# Patient Record
Sex: Male | Born: 1975 | Race: White | Hispanic: No | Marital: Single | Smoking: Current every day smoker
Health system: Southern US, Community
[De-identification: ages and names within clinical notes are randomized; demographics above are authoritative.]

---

## 1999-09-14 ENCOUNTER — Encounter: Payer: Self-pay | Admitting: Emergency Medicine

## 1999-09-14 ENCOUNTER — Emergency Department (HOSPITAL_COMMUNITY): Admission: EM | Admit: 1999-09-14 | Discharge: 1999-09-14 | Payer: Self-pay

## 2001-09-26 ENCOUNTER — Emergency Department (HOSPITAL_COMMUNITY): Admission: EM | Admit: 2001-09-26 | Discharge: 2001-09-26 | Payer: Self-pay | Admitting: Emergency Medicine

## 2001-09-26 ENCOUNTER — Encounter: Payer: Self-pay | Admitting: Emergency Medicine

## 2002-10-22 ENCOUNTER — Encounter: Payer: Self-pay | Admitting: *Deleted

## 2002-10-22 ENCOUNTER — Emergency Department (HOSPITAL_COMMUNITY): Admission: EM | Admit: 2002-10-22 | Discharge: 2002-10-22 | Payer: Self-pay | Admitting: *Deleted

## 2002-11-09 ENCOUNTER — Encounter: Payer: Self-pay | Admitting: *Deleted

## 2002-11-09 ENCOUNTER — Emergency Department (HOSPITAL_COMMUNITY): Admission: EM | Admit: 2002-11-09 | Discharge: 2002-11-09 | Payer: Self-pay | Admitting: Emergency Medicine

## 2003-03-17 ENCOUNTER — Emergency Department (HOSPITAL_COMMUNITY): Admission: EM | Admit: 2003-03-17 | Discharge: 2003-03-17 | Payer: Self-pay | Admitting: Emergency Medicine

## 2004-02-12 ENCOUNTER — Emergency Department (HOSPITAL_COMMUNITY): Admission: EM | Admit: 2004-02-12 | Discharge: 2004-02-12 | Payer: Self-pay | Admitting: Emergency Medicine

## 2004-04-02 ENCOUNTER — Emergency Department (HOSPITAL_COMMUNITY): Admission: EM | Admit: 2004-04-02 | Discharge: 2004-04-02 | Payer: Self-pay | Admitting: Emergency Medicine

## 2004-08-28 ENCOUNTER — Emergency Department (HOSPITAL_COMMUNITY): Admission: EM | Admit: 2004-08-28 | Discharge: 2004-08-28 | Payer: Self-pay | Admitting: Emergency Medicine

## 2005-02-24 ENCOUNTER — Ambulatory Visit (HOSPITAL_COMMUNITY): Admission: RE | Admit: 2005-02-24 | Discharge: 2005-02-24 | Payer: Self-pay | Admitting: Family Medicine

## 2005-03-05 ENCOUNTER — Emergency Department (HOSPITAL_COMMUNITY): Admission: EM | Admit: 2005-03-05 | Discharge: 2005-03-05 | Payer: Self-pay | Admitting: Emergency Medicine

## 2005-03-08 ENCOUNTER — Emergency Department (HOSPITAL_COMMUNITY): Admission: EM | Admit: 2005-03-08 | Discharge: 2005-03-08 | Payer: Self-pay | Admitting: Emergency Medicine

## 2005-09-08 ENCOUNTER — Emergency Department (HOSPITAL_COMMUNITY): Admission: EM | Admit: 2005-09-08 | Discharge: 2005-09-09 | Payer: Self-pay | Admitting: Emergency Medicine

## 2006-04-03 IMAGING — CR DG HAND COMPLETE 3+V*R*
3 series · 3 of 3 positions shown · non-contrast
Comparison: none

CLINICAL DATA: Pain in the arm; third finger swollen; no known injury 
 RIGHT HAND COMPLETE:
 There is mild soft tissue swelling of the third finger.  I see no fracture or erosive arthropathy.  No foreign body or calcifications can be seen.

[view not recorded (1 of 3)]
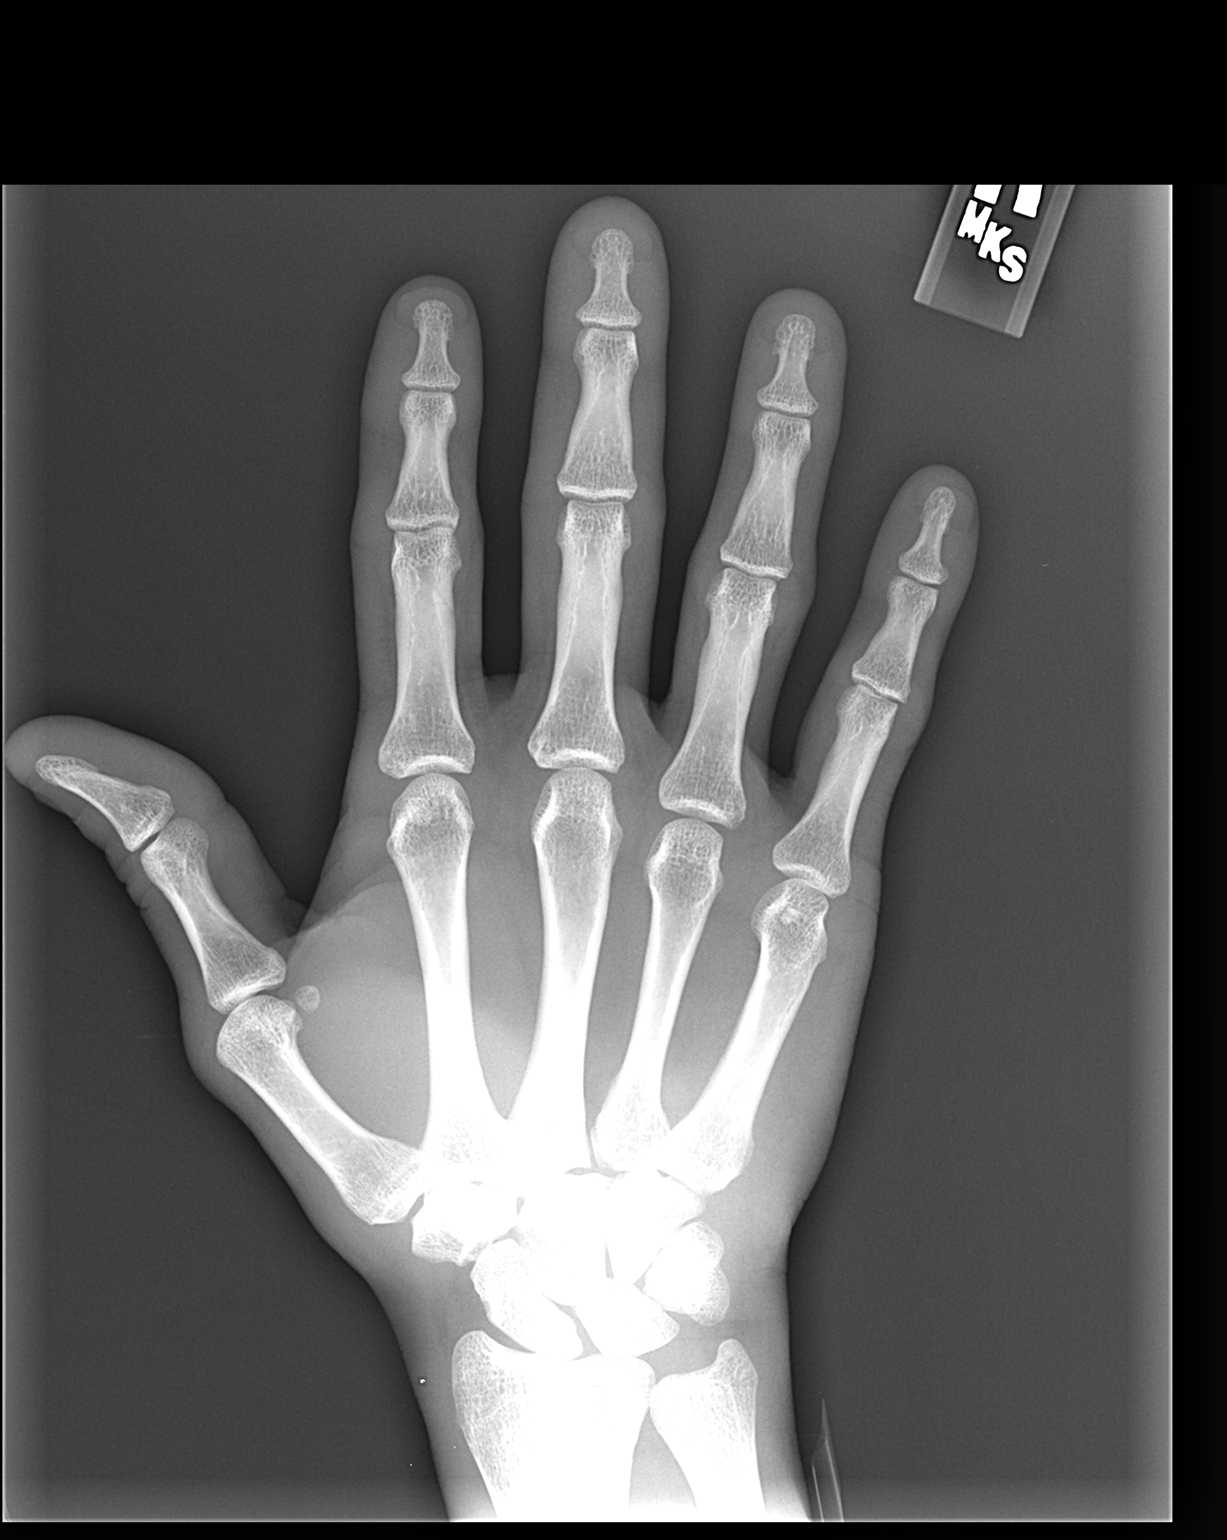

[view not recorded (2 of 3)]
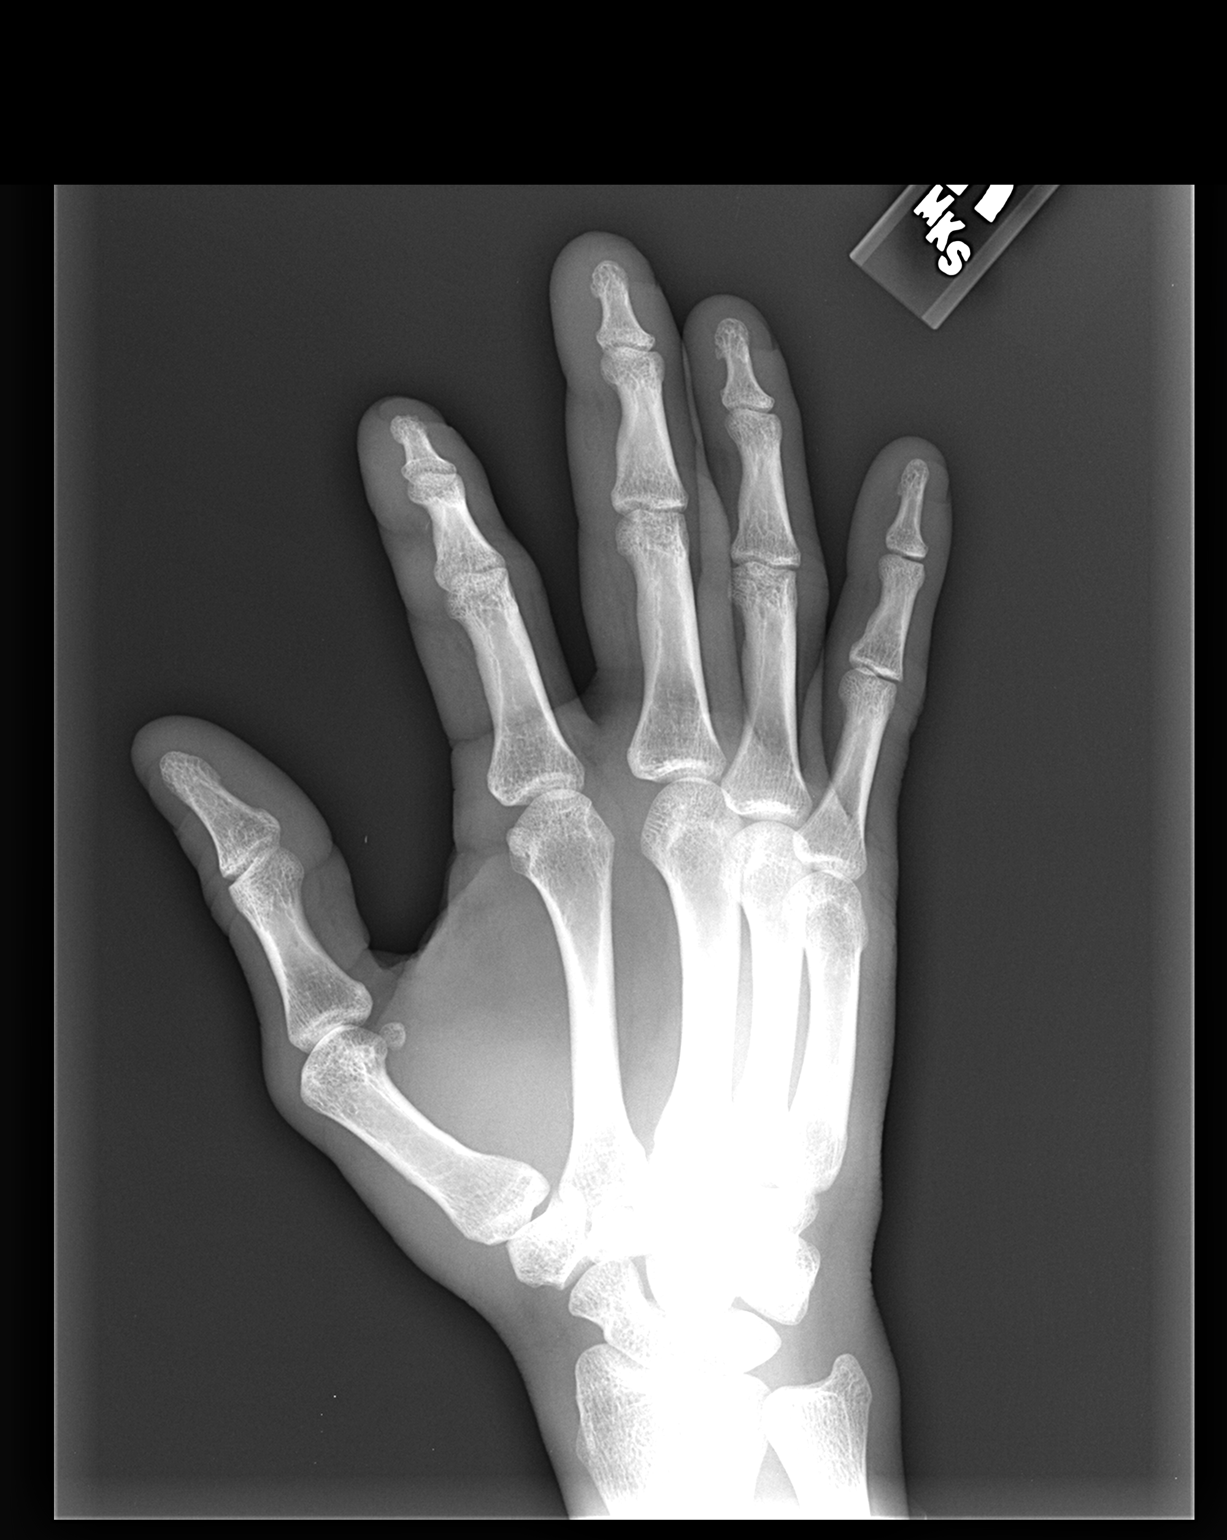

[view not recorded (3 of 3)]
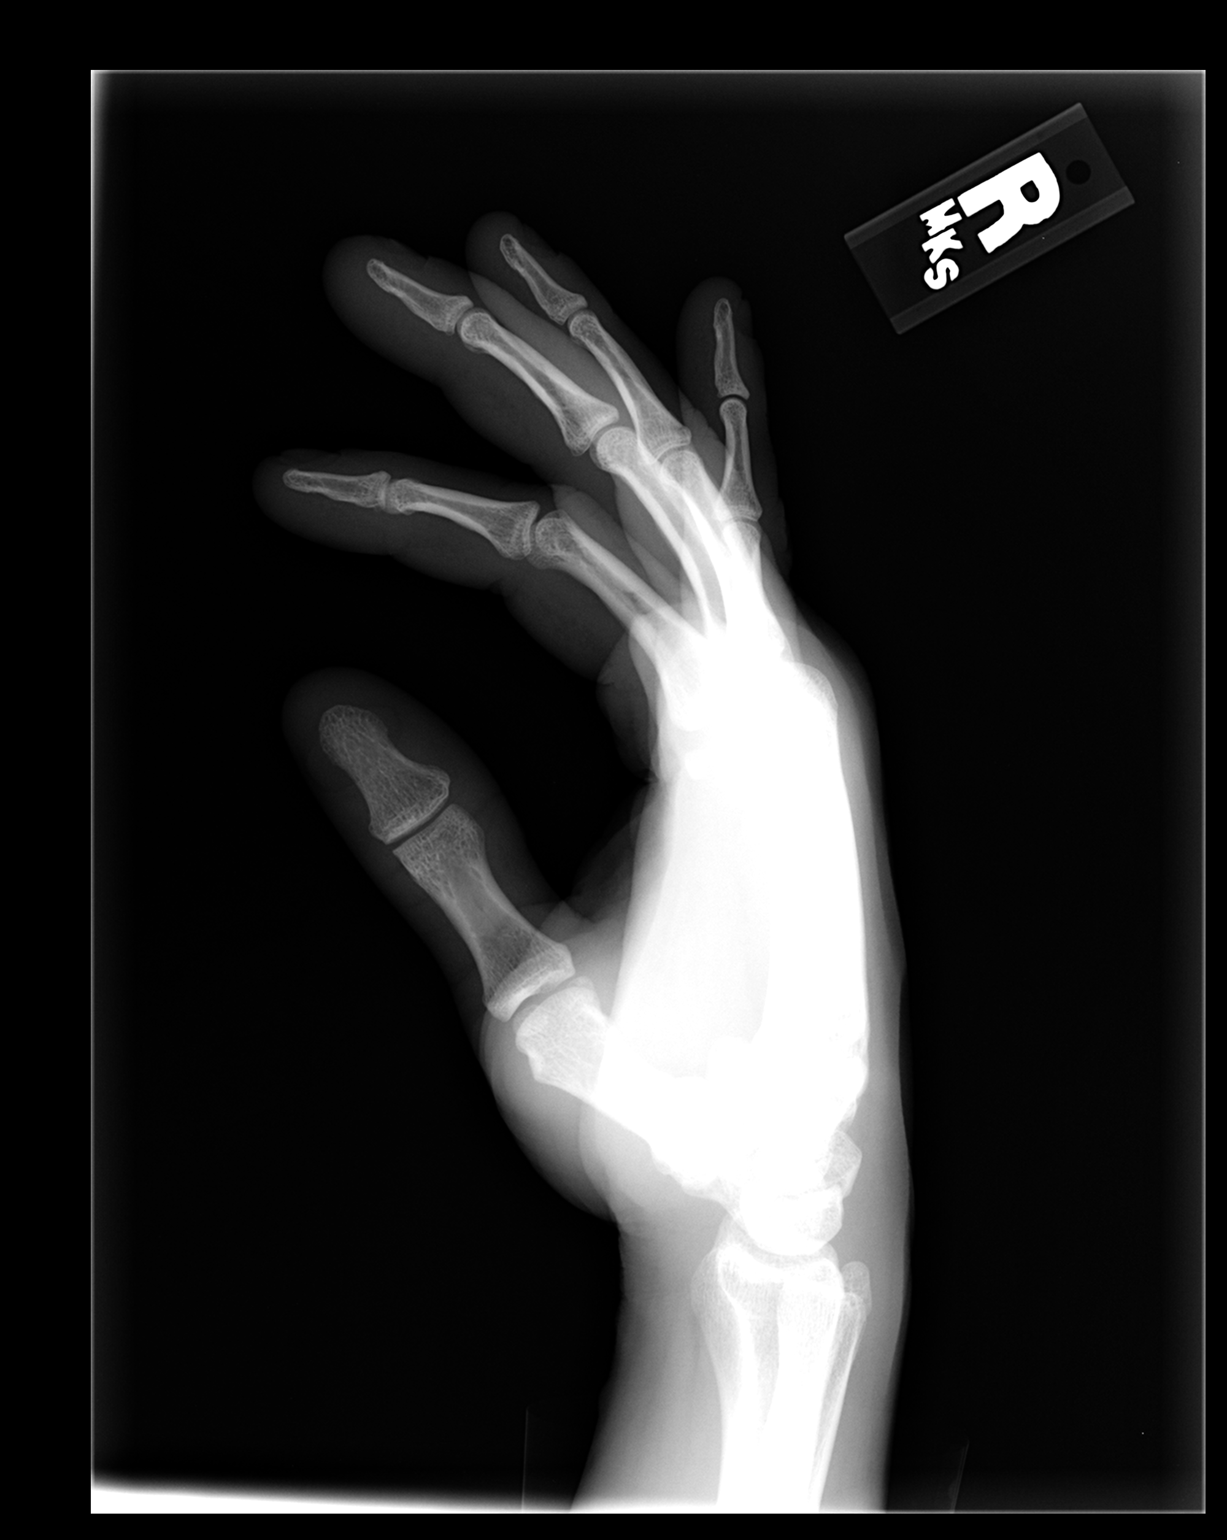

[3 of 3 positions shown; findings below may reference images not displayed]

IMPRESSION: Mild soft tissue swelling third finger.  No fracture or other acute pathology.

## 2006-04-13 ENCOUNTER — Emergency Department (HOSPITAL_COMMUNITY): Admission: EM | Admit: 2006-04-13 | Discharge: 2006-04-13 | Payer: Self-pay | Admitting: Emergency Medicine

## 2006-06-05 ENCOUNTER — Emergency Department (HOSPITAL_COMMUNITY): Admission: EM | Admit: 2006-06-05 | Discharge: 2006-06-05 | Payer: Self-pay | Admitting: Emergency Medicine

## 2006-06-07 ENCOUNTER — Emergency Department (HOSPITAL_COMMUNITY): Admission: EM | Admit: 2006-06-07 | Discharge: 2006-06-07 | Payer: Self-pay | Admitting: Emergency Medicine

## 2008-03-19 ENCOUNTER — Emergency Department (HOSPITAL_COMMUNITY): Admission: EM | Admit: 2008-03-19 | Discharge: 2008-03-19 | Payer: Self-pay | Admitting: Emergency Medicine

## 2009-07-10 ENCOUNTER — Emergency Department (HOSPITAL_COMMUNITY): Admission: EM | Admit: 2009-07-10 | Discharge: 2009-07-10 | Payer: Self-pay | Admitting: Emergency Medicine

## 2009-07-22 ENCOUNTER — Emergency Department (HOSPITAL_COMMUNITY): Admission: EM | Admit: 2009-07-22 | Discharge: 2009-07-22 | Payer: Self-pay | Admitting: Emergency Medicine

## 2009-09-30 ENCOUNTER — Emergency Department (HOSPITAL_COMMUNITY): Admission: EM | Admit: 2009-09-30 | Discharge: 2009-09-30 | Payer: Self-pay | Admitting: Emergency Medicine

## 2011-01-01 ENCOUNTER — Emergency Department (HOSPITAL_COMMUNITY)
Admission: EM | Admit: 2011-01-01 | Discharge: 2011-01-01 | Disposition: A | Discharge status: Home / Self Care | Payer: Self-pay | Attending: Emergency Medicine | Admitting: Emergency Medicine

## 2011-01-01 DIAGNOSIS — K089 Disorder of teeth and supporting structures, unspecified: Secondary | ICD-10-CM | POA: Insufficient documentation

## 2011-03-08 LAB — WOUND CULTURE

## 2011-04-06 ENCOUNTER — Emergency Department (HOSPITAL_COMMUNITY)
Admission: EM | Admit: 2011-04-06 | Discharge: 2011-04-06 | Discharge status: Against Medical Advice | Payer: Self-pay | Attending: Emergency Medicine | Admitting: Emergency Medicine

## 2011-04-06 DIAGNOSIS — J45909 Unspecified asthma, uncomplicated: Secondary | ICD-10-CM | POA: Insufficient documentation

## 2011-04-06 DIAGNOSIS — F1210 Cannabis abuse, uncomplicated: Secondary | ICD-10-CM | POA: Insufficient documentation

## 2011-04-06 DIAGNOSIS — F1010 Alcohol abuse, uncomplicated: Secondary | ICD-10-CM | POA: Insufficient documentation

## 2011-04-06 DIAGNOSIS — F1410 Cocaine abuse, uncomplicated: Secondary | ICD-10-CM | POA: Insufficient documentation

## 2011-04-06 DIAGNOSIS — F3130 Bipolar disorder, current episode depressed, mild or moderate severity, unspecified: Secondary | ICD-10-CM | POA: Insufficient documentation

## 2011-04-06 DIAGNOSIS — F17200 Nicotine dependence, unspecified, uncomplicated: Secondary | ICD-10-CM | POA: Insufficient documentation

## 2011-04-06 DIAGNOSIS — F411 Generalized anxiety disorder: Secondary | ICD-10-CM | POA: Insufficient documentation

## 2011-04-06 LAB — URINALYSIS, ROUTINE W REFLEX MICROSCOPIC
Bilirubin Urine: NEGATIVE
Ketones, ur: NEGATIVE mg/dL
Specific Gravity, Urine: <1.005 — ABNORMAL LOW (ref 1.005–1.030)
Urobilinogen, UA: 0.2 mg/dL (ref 0.0–1.0)

## 2011-04-06 LAB — RAPID URINE DRUG SCREEN, HOSP PERFORMED
Benzodiazepines: NOT DETECTED
Tetrahydrocannabinol: POSITIVE — AB

## 2011-10-22 ENCOUNTER — Emergency Department (HOSPITAL_COMMUNITY): Payer: Self-pay

## 2011-10-22 ENCOUNTER — Emergency Department (HOSPITAL_COMMUNITY)
Admission: EM | Admit: 2011-10-22 | Discharge: 2011-10-22 | Disposition: A | Discharge status: Home / Self Care | Payer: Self-pay | Attending: Emergency Medicine | Admitting: Emergency Medicine

## 2011-10-22 ENCOUNTER — Encounter: Payer: Self-pay | Admitting: Emergency Medicine

## 2011-10-22 DIAGNOSIS — J40 Bronchitis, not specified as acute or chronic: Secondary | ICD-10-CM | POA: Insufficient documentation

## 2011-10-22 DIAGNOSIS — F17200 Nicotine dependence, unspecified, uncomplicated: Secondary | ICD-10-CM | POA: Insufficient documentation

## 2011-10-22 DIAGNOSIS — J45909 Unspecified asthma, uncomplicated: Secondary | ICD-10-CM | POA: Insufficient documentation

## 2011-10-22 MED ORDER — IPRATROPIUM BROMIDE 0.02 % IN SOLN
0.5000 mg | Freq: Once | RESPIRATORY_TRACT | Status: AC
  Administered 2011-10-22: 0.5 mg via RESPIRATORY_TRACT
  Filled 2011-10-22: qty 2.5

## 2011-10-22 MED ORDER — ALBUTEROL SULFATE HFA 108 (90 BASE) MCG/ACT IN AERS
2.0000 | INHALATION_SPRAY | Freq: Once | RESPIRATORY_TRACT | Status: AC
  Administered 2011-10-22: 2 via RESPIRATORY_TRACT
  Filled 2011-10-22: qty 6.7

## 2011-10-22 MED ORDER — AZITHROMYCIN 250 MG PO TABS: 250.0000 mg | ORAL_TABLET | Freq: Every day | ORAL | Status: AC

## 2011-10-22 MED ORDER — ALBUTEROL SULFATE (5 MG/ML) 0.5% IN NEBU
5.0000 mg | INHALATION_SOLUTION | Freq: Once | RESPIRATORY_TRACT | Status: AC
  Administered 2011-10-22: 5 mg via RESPIRATORY_TRACT
  Filled 2011-10-22: qty 1

## 2011-10-22 MED ORDER — ALBUTEROL SULFATE HFA 108 (90 BASE) MCG/ACT IN AERS: 1.0000 | INHALATION_SPRAY | Freq: Four times a day (QID) | RESPIRATORY_TRACT | Status: DC | PRN

## 2011-10-22 MED ORDER — PREDNISONE 50 MG PO TABS: 50.0000 mg | ORAL_TABLET | Freq: Every day | ORAL | Status: AC

## 2011-10-22 NOTE — ED Notes (Signed)
Respiratory paged for breathing treatment 

## 2011-10-22 NOTE — ED Provider Notes (Signed)
Scribed for James Hutching, MD, the patient was seen in room APA06/APA06. This chart was scribed by [employer]. The patient's care started at 09:43  CSN: [id number] Arrival date & time: 10/22/2011  9:33 AM   First MD Initiated Contact with Patient 10/22/11 [phone]      Chief Complaint  Patient presents with  . Nasal Congestion  . Cough  . Shortness of Breath    HPI James Baker is a 35 y.o. male with a history of Asthma who presents to the Emergency Department complaining of Nasal congestion, with associated cough, chills, cold sweats, fever and shortness of breath for 3 days. He states that cough is productive with thick green sputum. He also complains of running out of his breathing treatment and currently uses his mother's as needed.  Past Medical History  Diagnosis Date  . Asthma     History reviewed. No pertinent past surgical history.  Family History  Problem Relation Age of Onset  . Cancer Mother   . Stroke Father   . Seizures Father   . Cancer Other     History  Substance Use Topics  . Smoking status: Current Everyday Smoker -- 1.0 packs/day for 15 years    Types: Cigarettes  . Smokeless tobacco: Never Used  . Alcohol Use: 1.8 oz/week    3 Cans of beer per week     occasionally      Review of Systems  Constitutional: Positive for fever and chills.       10 Systems reviewed and are negative for acute change except as noted in the HPI.  HENT: Positive for congestion.   Eyes: Negative for discharge and redness.  Respiratory: Positive for cough and shortness of breath.   Cardiovascular: Negative for chest pain.  Gastrointestinal: Negative for vomiting and abdominal pain.  Musculoskeletal: Negative for back pain.  Skin: Negative for rash.  Neurological: Negative for syncope, numbness and headaches.  Psychiatric/Behavioral:       No behavior change.  All other systems reviewed and are negative.    Allergies  Hydrocodone  Home Medications   Current  Outpatient Rx  Name Route Sig Dispense Refill  . ACETAMINOPHEN 500 MG PO TABS Oral Take 1,500 mg by mouth every 6 (six) hours as needed. For fever     . ALBUTEROL SULFATE HFA 108 (90 BASE) MCG/ACT IN AERS Inhalation Inhale 2 puffs into the lungs every 4 (four) hours as needed. For shortness of breath       BP 131/74  Pulse 57  Temp(Src) 98 F (36.7 C) (Oral)  Resp 18  Ht 5\' 11"  (1.803 m)  Wt 135 lb (61.236 kg)  BMI 18.83 kg/m2  SpO2 96%  Physical Exam  Nursing note and vitals reviewed. Constitutional: He is oriented to person, place, and time. He appears well-developed and well-nourished.  Non-toxic appearance. He does not have a sickly appearance.  HENT:  Head: Normocephalic and atraumatic.  Eyes: Conjunctivae, EOM and lids are normal. Pupils are equal, round, and reactive to light.  Neck: Trachea normal, normal range of motion and full passive range of motion without pain. Neck supple.  Cardiovascular: Regular rhythm and normal heart sounds.   Pulmonary/Chest: Effort normal. No respiratory distress. He has wheezes (Expiratory wheezes bilaterally).  Abdominal: Soft. Normal appearance. He exhibits no distension. There is no tenderness. There is no rebound and no CVA tenderness.  Musculoskeletal: Normal range of motion.  Neurological: He is alert and oriented to person, place, and time. He has  normal strength.  Skin: Skin is warm, dry and intact. No rash noted.  Psychiatric: He has a normal mood and affect.    ED Course  Procedures  DIAGNOSTIC STUDIES: Oxygen Saturation is 96% on room air, normal by my interpretation.    COORDINATION OF CARE: 10:10 - EDP examined patient at bedside. Patient to be given a breathing treatment. Chest X-ray ordered. Patient advised to cease smoking.   Dg Chest 2 View  10/22/2011  *RADIOLOGY REPORT*  Clinical Data: Smoker, asthma, cough  CHEST - 2 VIEW  Comparison: None  Findings: Normal heart size, mediastinal contours, and pulmonary vascularity.  Lungs are hyperexpanded with bronchitic changes. No pulmonary infiltrate, pleural effusion, or pneumothorax. Bones appear demineralized.  IMPRESSION: Hyperexpanded lungs with bronchitic changes. No acute infiltrate.  Original Report Authenticated By: Lollie Marrow, M.D.    MDM..... patient is a long-time smoker..  has had upper respiratory infection with wheezing.  x-ray negative. Charge with antibiotic, prednisone, inhaler.     James Hutching, MD 10/22/11 1137

## 2011-10-22 NOTE — ED Notes (Signed)
Patient c/o congestion, productive cough with thick green sputum, and shortness of breath. Per patient hx of asthma. Patient reports "running out" of inhaler medication.

## 2013-04-19 ENCOUNTER — Encounter (HOSPITAL_COMMUNITY): Payer: Self-pay | Admitting: *Deleted

## 2013-04-19 ENCOUNTER — Emergency Department (HOSPITAL_COMMUNITY)
Admission: EM | Admit: 2013-04-19 | Discharge: 2013-04-19 | Disposition: A | Discharge status: Home / Self Care | Payer: Self-pay | Attending: Emergency Medicine | Admitting: Emergency Medicine

## 2013-04-19 DIAGNOSIS — Z79899 Other long term (current) drug therapy: Secondary | ICD-10-CM | POA: Insufficient documentation

## 2013-04-19 DIAGNOSIS — T7840XA Allergy, unspecified, initial encounter: Secondary | ICD-10-CM

## 2013-04-19 DIAGNOSIS — J45909 Unspecified asthma, uncomplicated: Secondary | ICD-10-CM | POA: Insufficient documentation

## 2013-04-19 DIAGNOSIS — F17200 Nicotine dependence, unspecified, uncomplicated: Secondary | ICD-10-CM | POA: Insufficient documentation

## 2013-04-19 DIAGNOSIS — L500 Allergic urticaria: Secondary | ICD-10-CM | POA: Insufficient documentation

## 2013-04-19 MED ORDER — EPINEPHRINE 0.3 MG/0.3ML IJ SOAJ
0.3000 mg | Freq: Once | INTRAMUSCULAR | Status: AC
  Administered 2013-04-19: 0.3 mg via INTRAMUSCULAR
  Filled 2013-04-19: qty 0.3

## 2013-04-19 NOTE — ED Notes (Signed)
Pt has a rash that has been coming & going for the past 3 days. At this time rash noted to the right upper thigh & right arm. Pt states did stay in motel a few days when started & has been in a new building under [employer] representative.

## 2013-04-19 NOTE — ED Notes (Signed)
Rash to  Arms , legs , trunk intermittently for 3 days

## 2013-04-19 NOTE — ED Notes (Signed)
Pt alert & oriented x4, stable gait. Patient given discharge instructions, paperwork & prescription(s). Patient  instructed to stop at the registration desk to finish any additional paperwork. Patient verbalized understanding. Pt left department w/ no further questions. 

## 2013-04-19 NOTE — ED Provider Notes (Signed)
History  This chart was scribed for James Lennert, MD by James Baker, ED Scribe. This patient was seen in room APA04/APA04 and the patient's care was started at 8:55 PM.  CSN: [id number]  Arrival date & time 04/19/13  2026   First MD Initiated Contact with Patient 04/19/13 2055      Chief Complaint  Patient presents with  . Urticaria     Patient is a 37 y.o. male presenting with urticaria. The history is provided by the patient. No language interpreter was used.  Urticaria This is a new problem. The current episode started more than 2 days ago. The problem occurs daily. The problem has not changed since onset.Pertinent negatives include no chest pain, no abdominal pain and no headaches. Nothing aggravates the symptoms. Nothing relieves the symptoms. He has tried nothing for the symptoms.    HPI Comments: James Baker is a 37 y.o. male who presents to the Emergency Department complaining of 3 days of intermittent urticaria to the bilateral arms, legs and trunk. It is currently only on his right leg only. He reports that he recently traveled out of state and expresses concern over the symptoms being insect related. He denies any other symptoms currently. He has a h/o asthma. Pt is a current everyday smoker and occasional alcohol user.  Past Medical History  Diagnosis Date  . Asthma     History reviewed. No pertinent past surgical history.  Family History  Problem Relation Age of Onset  . Cancer Mother   . Stroke Father   . Seizures Father   . Cancer Other     History  Substance Use Topics  . Smoking status: Current Every Day Smoker -- 1.00 packs/day for 15 years    Types: Cigarettes  . Smokeless tobacco: Never Used  . Alcohol Use: 1.8 oz/week    3 Cans of beer per week     Comment: occasionally      Review of Systems  Constitutional: Negative for appetite change and fatigue.  HENT: Negative for congestion, sinus pressure and ear discharge.   Eyes: Negative  for discharge.  Respiratory: Negative for cough.   Cardiovascular: Negative for chest pain.  Gastrointestinal: Negative for abdominal pain and diarrhea.  Genitourinary: Negative for frequency and hematuria.  Musculoskeletal: Negative for back pain.  Skin: Positive for rash.  Neurological: Negative for seizures and headaches.  Psychiatric/Behavioral: Negative for hallucinations.    Allergies  Hydrocodone  Home Medications   Current Outpatient Rx  Name  Route  Sig  Dispense  Refill  . acetaminophen (TYLENOL) 500 MG tablet   Oral   Take 1,500 mg by mouth every 6 (six) hours as needed. For fever          . albuterol (PROVENTIL HFA;VENTOLIN HFA) 108 (90 BASE) MCG/ACT inhaler   Inhalation   Inhale 2 puffs into the lungs every 4 (four) hours as needed. For shortness of breath          . EXPIRED: albuterol (PROVENTIL HFA;VENTOLIN HFA) 108 (90 BASE) MCG/ACT inhaler   Inhalation   Inhale 1-2 puffs into the lungs every 6 (six) hours as needed for wheezing.   1 Inhaler   0     Triage Vitals: BP 125/81  Pulse 97  Temp(Src) 97.9 F (36.6 C) (Oral)  Resp 20  Ht 5\' 10"  (1.778 m)  Wt 135 lb (61.236 kg)  BMI 19.37 kg/m2  SpO2 97%  Physical Exam  Nursing note and vitals reviewed. Constitutional: He is  oriented to person, place, and time. He appears well-developed and well-nourished.  HENT:  Head: Normocephalic and atraumatic.  Eyes: Conjunctivae are normal.  Neck: No tracheal deviation present.  Cardiovascular: Normal rate.   Pulmonary/Chest: Effort normal. No respiratory distress.  Musculoskeletal: Normal range of motion. He exhibits no edema.  Neurological: He is alert and oriented to person, place, and time.  Skin: Skin is warm and dry. No rash noted. There is erythema.  Allergic rash to his right lower leg and abdomen   Psychiatric: He has a normal mood and affect. His behavior is normal.    ED Course  Procedures (including critical care time)  Medications   EPINEPHrine (EPI-PEN) injection 0.3 mg (not administered)    DIAGNOSTIC STUDIES: Oxygen Saturation is 97% on room air, normal by my interpretation.    COORDINATION OF CARE: 9:25 PM-Discussed treatment plan which includes epi-pen injection with pt at bedside and pt agreed to plan.   Labs Reviewed - No data to display No results found.   No diagnosis found.    MDM        The chart was scribed for me under my direct supervision.  I personally performed the history, physical, and medical decision making and all procedures in the evaluation of this patient.James Lennert, MD 04/19/13 2251

## 2014-09-09 ENCOUNTER — Emergency Department (HOSPITAL_COMMUNITY)
Admission: EM | Admit: 2014-09-09 | Discharge: 2014-09-09 | Disposition: A | Discharge status: Home / Self Care | Payer: Self-pay | Attending: Emergency Medicine | Admitting: Emergency Medicine

## 2014-09-09 ENCOUNTER — Encounter (HOSPITAL_COMMUNITY): Payer: Self-pay | Admitting: Emergency Medicine

## 2014-09-09 DIAGNOSIS — H18821 Corneal disorder due to contact lens, right eye: Secondary | ICD-10-CM

## 2014-09-09 DIAGNOSIS — Z791 Long term (current) use of non-steroidal anti-inflammatories (NSAID): Secondary | ICD-10-CM | POA: Insufficient documentation

## 2014-09-09 DIAGNOSIS — Z720 Tobacco use: Secondary | ICD-10-CM | POA: Insufficient documentation

## 2014-09-09 DIAGNOSIS — Z79899 Other long term (current) drug therapy: Secondary | ICD-10-CM | POA: Insufficient documentation

## 2014-09-09 DIAGNOSIS — J45909 Unspecified asthma, uncomplicated: Secondary | ICD-10-CM | POA: Insufficient documentation

## 2014-09-09 DIAGNOSIS — S0501XA Injury of conjunctiva and corneal abrasion without foreign body, right eye, initial encounter: Secondary | ICD-10-CM | POA: Insufficient documentation

## 2014-09-09 MED ORDER — OXYCODONE-ACETAMINOPHEN 5-325 MG PO TABS
1.0000 | ORAL_TABLET | Freq: Once | ORAL | Status: AC
  Administered 2014-09-09: 1 via ORAL
  Filled 2014-09-09: qty 1

## 2014-09-09 MED ORDER — HOMATROPINE HBR 5 % OP SOLN
2.0000 [drp] | Freq: Once | OPHTHALMIC | Status: AC
  Administered 2014-09-09: 2 [drp] via OPHTHALMIC
  Filled 2014-09-09: qty 5

## 2014-09-09 MED ORDER — OXYCODONE-ACETAMINOPHEN 5-325 MG PO TABS: 1.0000 | ORAL_TABLET | Freq: Four times a day (QID) | ORAL | Status: DC | PRN

## 2014-09-09 MED ORDER — TETRACAINE HCL 0.5 % OP SOLN
OPHTHALMIC | Status: AC
  Administered 2014-09-09: 2 [drp] via OPHTHALMIC
  Filled 2014-09-09: qty 2

## 2014-09-09 MED ORDER — KETOROLAC TROMETHAMINE 10 MG PO TABS
10.0000 mg | ORAL_TABLET | Freq: Once | ORAL | Status: AC
  Administered 2014-09-09: 10 mg via ORAL
  Filled 2014-09-09: qty 1

## 2014-09-09 MED ORDER — TOBRAMYCIN 0.3 % OP SOLN
2.0000 [drp] | Freq: Once | OPHTHALMIC | Status: AC
  Administered 2014-09-09: 2 [drp] via OPHTHALMIC
  Filled 2014-09-09: qty 5

## 2014-09-09 MED ORDER — TETRACAINE HCL 0.5 % OP SOLN
2.0000 [drp] | Freq: Once | OPHTHALMIC | Status: AC
  Administered 2014-09-09: 2 [drp] via OPHTHALMIC

## 2014-09-09 NOTE — Discharge Instructions (Signed)
Please apply a cool compress to your eye several times during the day. Please use dark glasses and a hat with a brim to protect the eye from bright lights. Please use 2 drops of tobramycin eye solution to the right eye every 4 hours for the next 5 days. Please use 600 mg of ibuprofen with each meal and at bedtime for swelling and inflammation. May use Percocet every 6 hours if needed for pain. This medication may cause drowsiness, please use with caution. Please see Dr. Bing PlumeHaynes, or the eye specialist of your choice on Monday or Tuesday of next week. Please do not use your contact lens for the next 2 weeks. Corneal Abrasion The cornea is the clear covering at the front and center of the eye. When looking at the colored portion of the eye (iris), you are looking through the cornea. This very thin tissue is made up of many layers. The surface layer is a single layer of cells (corneal epithelium) and is one of the most sensitive tissues in the body. If a scratch or injury causes the corneal epithelium to come off, it is called a corneal abrasion. If the injury extends to the tissues below the epithelium, the condition is called a corneal ulcer. CAUSES   Scratches.  Trauma.  Foreign body in the eye. Some people have recurrences of abrasions in the area of the original injury even after it has healed (recurrent erosion syndrome). Recurrent erosion syndrome generally improves and goes away with time. SYMPTOMS   Eye pain.  Difficulty or inability to keep the injured eye open.  The eye becomes very sensitive to light.  Recurrent erosions tend to happen suddenly, first thing in the morning, usually after waking up and opening the eye. DIAGNOSIS  Your health care provider can diagnose a corneal abrasion during an eye exam. Dye is usually placed in the eye using a drop or a small paper strip moistened by your tears. When the eye is examined with a special light, the abrasion shows up clearly because of the  dye. TREATMENT   Small abrasions may be treated with antibiotic drops or ointment alone.  A pressure patch may be put over the eye. If this is done, follow your doctor's instructions for when to remove the patch. Do not drive or use machines while the eye patch is on. Judging distances is hard to do with a patch on. If the abrasion becomes infected and spreads to the deeper tissues of the cornea, a corneal ulcer can result. This is serious because it can cause corneal scarring. Corneal scars interfere with light passing through the cornea and cause a loss of vision in the involved eye. HOME CARE INSTRUCTIONS  Use medicine or ointment as directed. Only take over-the-counter or prescription medicines for pain, discomfort, or fever as directed by your health care provider.  Do not drive or operate machinery if your eye is patched. Your ability to judge distances is impaired.  If your health care provider has given you a follow-up appointment, it is very important to keep that appointment. Not keeping the appointment could result in a severe eye infection or permanent loss of vision. If there is any problem keeping the appointment, let your health care provider know. SEEK MEDICAL CARE IF:   You have pain, light sensitivity, and a scratchy feeling in one eye or both eyes.  Your pressure patch keeps loosening up, and you can blink your eye under the patch after treatment.  Any kind of  discharge develops from the eye after treatment or if the lids stick together in the morning.  You have the same symptoms in the morning as you did with the original abrasion days, weeks, or months after the abrasion healed. MAKE SURE YOU:   Understand these instructions.  Will watch your condition.  Will get help right away if you are not doing well or get worse. Document Released: 11/14/2000 Document Revised: 11/22/2013 Document Reviewed: 07/25/2013 [facility]Exit[facility] Patient Information 2015 [place], [place]. This  information is not intended to replace advice given to you by your health care provider. Make sure you discuss any questions you have with your health care provider.

## 2014-09-09 NOTE — ED Provider Notes (Signed)
CSN: [id number]     Arrival date & time 09/09/14  1909 History   First MD Initiated Contact with Patient 09/09/14 1947     No chief complaint on file.    (Consider location/radiation/quality/duration/timing/severity/associated sxs/prior Treatment) HPI Comments: Pt slept with contact in the right eye last night. Pain this AM. The pain has continued throughout the day and seems to be getting progressively worse. Patient states he took the contact out, but continues to have pain involving the right eye. He presents to the emergency department at this time for evaluation and treatment of this problem. No fever or chills. No pus like drainage appreciated.  The history is provided by the patient.    Past Medical History  Diagnosis Date  . Asthma    History reviewed. No pertinent past surgical history. Family History  Problem Relation Age of Onset  . Cancer Mother   . Stroke Father   . Seizures Father   . Cancer Other    History  Substance Use Topics  . Smoking status: Current Every Day Smoker -- 1.00 packs/day for 15 years    Types: Cigarettes  . Smokeless tobacco: Never Used  . Alcohol Use: 1.8 oz/week    3 Cans of beer per week     Comment: occasionally    Review of Systems  Constitutional: Negative for activity change.       All ROS Neg except as noted in HPI  Eyes: Positive for photophobia, pain and redness. Negative for discharge.  Respiratory: Negative for cough, shortness of breath and wheezing.   Cardiovascular: Negative for chest pain and palpitations.  Gastrointestinal: Negative for abdominal pain and blood in stool.  Genitourinary: Negative for dysuria, frequency and hematuria.  Musculoskeletal: Negative for arthralgias, back pain and neck pain.  Skin: Negative.   Neurological: Negative for dizziness, seizures and speech difficulty.  Psychiatric/Behavioral: Negative for hallucinations and confusion.      Allergies  Hydrocodone  Home Medications   Prior  to Admission medications   Medication Sig Start Date End Date Taking? Authorizing Provider  albuterol (PROVENTIL HFA;VENTOLIN HFA) 108 (90 BASE) MCG/ACT inhaler Inhale 2 puffs into the lungs every 4 (four) hours as needed. For shortness of breath    Yes Historical Provider, MD  ibuprofen (ADVIL,MOTRIN) 200 MG tablet Take 200 mg by mouth every 6 (six) hours as needed for pain.   Yes Historical Provider, MD   BP 127/84  Pulse 64  Temp(Src) 97.8 F (36.6 C) (Oral)  Resp 17  Ht 5\' 10"  (1.778 m)  Wt 135 lb (61.236 kg)  BMI 19.37 kg/m2  SpO2 100% Physical Exam  Nursing note and vitals reviewed. Constitutional: He is oriented to person, place, and time. He appears well-developed and well-nourished.  Non-toxic appearance.  HENT:  Head: Normocephalic.  Right Ear: Tympanic membrane and external ear normal.  Left Ear: Tympanic membrane and external ear normal.  Eyes: EOM and lids are normal. Pupils are equal, round, and reactive to light.    Neck: Normal range of motion. Neck supple. Carotid bruit is not present.  Cardiovascular: Normal rate, regular rhythm, normal heart sounds, intact distal pulses and normal pulses.   Pulmonary/Chest: Breath sounds normal. No respiratory distress.  Abdominal: Soft. Bowel sounds are normal. There is no tenderness. There is no guarding.  Musculoskeletal: Normal range of motion.  Lymphadenopathy:       Head (right side): No submandibular adenopathy present.       Head (left side): No submandibular adenopathy present.  He has no cervical adenopathy.  Neurological: He is alert and oriented to person, place, and time. He has normal strength. No cranial nerve deficit or sensory deficit.  Skin: Skin is warm and dry.  Psychiatric: He has a normal mood and affect. His speech is normal.    ED Course  Procedures (including critical care time) Labs Review Labs Reviewed - No data to display  Imaging Review No results found.   EKG Interpretation None       MDM  Patient has a corneal abrasion of the right eye. Patient seen with me by Dr.  Judd Lienelo. No evidence of residual foreign body in the right eye.  The patient was treated with home atropine, oxycodone, and tobramycin eyedrops. The patient is referred to Dr. Bing PlumeHaynes with ophthalmology for evaluation in the next 24-48 hours.    Final diagnoses:  None    **I have reviewed nursing notes, vital signs, and all appropriate lab and imaging results for this patient.Kathie Dike*    Geoff Dacanay M Mechell Girgis, PA-C 09/11/14 1318

## 2014-09-09 NOTE — ED Notes (Signed)
Pt states he slept in his contacts last night and now his right eye is red, itchy, and irritated. Pt states it is painful.

## 2014-09-12 NOTE — ED Provider Notes (Signed)
Medical screening examination/treatment/procedure(s) were conducted as a shared visit with non-physician practitioner(s) and myself.  I personally evaluated the patient during the encounter.  Patient presents with complaints of eye irritation.  He slept in his contacts last night.  No other injury or trauma.  On exam, vitals are stable and he is afebrile.  The right eye is noted to be injected with clear discharge.  The cornea appears to have an abrasion.  There is fluorescein uptake to the majority of the cornea.  Will treat with antibiotic drops, follow up with ophthalmology.   EKG Interpretation None          James Lyonsouglas Mallie Giambra, MD 09/12/14 0740

## 2016-03-29 ENCOUNTER — Emergency Department (HOSPITAL_COMMUNITY)
Admission: EM | Admit: 2016-03-29 | Discharge: 2016-03-29 | Disposition: A | Discharge status: Home / Self Care | Payer: Self-pay | Attending: Emergency Medicine | Admitting: Emergency Medicine

## 2016-03-29 ENCOUNTER — Encounter (HOSPITAL_COMMUNITY): Payer: Self-pay | Admitting: Emergency Medicine

## 2016-03-29 ENCOUNTER — Emergency Department (HOSPITAL_COMMUNITY): Payer: Self-pay

## 2016-03-29 DIAGNOSIS — L02512 Cutaneous abscess of left hand: Secondary | ICD-10-CM | POA: Insufficient documentation

## 2016-03-29 DIAGNOSIS — J45909 Unspecified asthma, uncomplicated: Secondary | ICD-10-CM | POA: Insufficient documentation

## 2016-03-29 DIAGNOSIS — L03114 Cellulitis of left upper limb: Secondary | ICD-10-CM | POA: Insufficient documentation

## 2016-03-29 DIAGNOSIS — Z23 Encounter for immunization: Secondary | ICD-10-CM | POA: Insufficient documentation

## 2016-03-29 DIAGNOSIS — F17210 Nicotine dependence, cigarettes, uncomplicated: Secondary | ICD-10-CM | POA: Insufficient documentation

## 2016-03-29 DIAGNOSIS — L03119 Cellulitis of unspecified part of limb: Secondary | ICD-10-CM

## 2016-03-29 MED ORDER — CLINDAMYCIN HCL 150 MG PO CAPS: 300.0000 mg | ORAL_CAPSULE | Freq: Three times a day (TID) | ORAL | Status: DC

## 2016-03-29 MED ORDER — IBUPROFEN 400 MG PO TABS
600.0000 mg | ORAL_TABLET | Freq: Once | ORAL | Status: AC
  Administered 2016-03-29: 600 mg via ORAL
  Filled 2016-03-29: qty 2

## 2016-03-29 MED ORDER — OXYCODONE-ACETAMINOPHEN 5-325 MG PO TABS
2.0000 | ORAL_TABLET | Freq: Once | ORAL | Status: AC
  Administered 2016-03-29: 2 via ORAL
  Filled 2016-03-29: qty 2

## 2016-03-29 MED ORDER — POVIDONE-IODINE 10 % EX SOLN
CUTANEOUS | Status: AC
  Filled 2016-03-29: qty 118

## 2016-03-29 MED ORDER — CLINDAMYCIN HCL 150 MG PO CAPS
600.0000 mg | ORAL_CAPSULE | Freq: Once | ORAL | Status: AC
  Administered 2016-03-29: 600 mg via ORAL
  Filled 2016-03-29: qty 4

## 2016-03-29 MED ORDER — TETANUS-DIPHTH-ACELL PERTUSSIS 5-2.5-18.5 LF-MCG/0.5 IM SUSP
0.5000 mL | Freq: Once | INTRAMUSCULAR | Status: AC
  Administered 2016-03-29: 0.5 mL via INTRAMUSCULAR
  Filled 2016-03-29: qty 0.5

## 2016-03-29 MED ORDER — LIDOCAINE HCL (PF) 1 % IJ SOLN
5.0000 mL | Freq: Once | INTRAMUSCULAR | Status: AC
  Administered 2016-03-29: 5 mL
  Filled 2016-03-29: qty 5

## 2016-03-29 NOTE — Discharge Instructions (Signed)
Abscess °An abscess is an infected area that contains a collection of pus and debris. It can occur in almost any part of the body. An abscess is also known as a furuncle or boil. °CAUSES  °An abscess occurs when tissue gets infected. This can occur from blockage of oil or sweat glands, infection of hair follicles, or a minor injury to the skin. As the body tries to fight the infection, pus collects in the area and creates pressure under the skin. This pressure causes pain. People with weakened immune systems have difficulty fighting infections and get certain abscesses more often.  °SYMPTOMS °Usually an abscess develops on the skin and becomes a painful mass that is red, warm, and tender. If the abscess forms under the skin, you may feel a moveable soft area under the skin. Some abscesses break open (rupture) on their own, but most will continue to get worse without care. The infection can spread deeper into the body and eventually into the bloodstream, causing you to feel ill.  °DIAGNOSIS  °Your caregiver will take your medical history and perform a physical exam. A sample of fluid may also be taken from the abscess to determine what is causing your infection. °TREATMENT  °Your caregiver may prescribe antibiotic medicines to fight the infection. However, taking antibiotics alone usually does not cure an abscess. Your caregiver may need to make a small cut (incision) in the abscess to drain the pus. In some cases, gauze is packed into the abscess to reduce pain and to continue draining the area. °HOME CARE INSTRUCTIONS  °· Only take over-the-counter or prescription medicines for pain, discomfort, or fever as directed by your caregiver. °· If you were prescribed antibiotics, take them as directed. Finish them even if you start to feel better. °· If gauze is used, follow your caregiver's directions for changing the gauze. °· To avoid spreading the infection: °· Keep your draining abscess covered with a  bandage. °· Wash your hands well. °· Do not share personal care items, towels, or whirlpools with others. °· Avoid skin contact with others. °· Keep your skin and clothes clean around the abscess. °· Keep all follow-up appointments as directed by your caregiver. °SEEK MEDICAL CARE IF:  °· You have increased pain, swelling, redness, fluid drainage, or bleeding. °· You have muscle aches, chills, or a general ill feeling. °· You have a fever. °MAKE SURE YOU:  °· Understand these instructions. °· Will watch your condition. °· Will get help right away if you are not doing well or get worse. °  °This information is not intended to replace advice given to you by your health care provider. Make sure you discuss any questions you have with your health care provider. °  °Document Released: 08/27/2005 Document Revised: 05/18/2012 Document Reviewed: 01/30/2012 °Elsevier Interactive Patient Education ©2016 [employer] ° °Incision and Drainage °Incision and drainage is a procedure in which a sac-like structure (cystic structure) is opened and drained. The area to be drained usually contains material such as pus, fluid, or blood.  °LET YOUR CAREGIVER KNOW ABOUT:  °· Allergies to medicine. °· Medicines taken, including vitamins, herbs, eyedrops, over-the-counter medicines, and creams. °· Use of steroids (by mouth or creams). °· Previous problems with anesthetics or numbing medicines. °· History of bleeding problems or blood clots. °· Previous surgery. °· Other health problems, including diabetes and kidney problems. °· Possibility of pregnancy, if this applies. °RISKS AND COMPLICATIONS °· Pain. °· Bleeding. °· Scarring. °· Infection. °BEFORE THE PROCEDURE  °  You may need to have an ultrasound or other imaging tests to see how large or deep your cystic structure is. Blood tests may also be used to determine if you have an infection or how severe the infection is. You may need to have a tetanus shot. °PROCEDURE  °The affected area  is cleaned with a cleaning fluid. The cyst area will then be numbed with a medicine (local anesthetic). A small incision will be made in the cystic structure. A syringe or catheter may be used to drain the contents of the cystic structure, or the contents may be squeezed out. The area will then be flushed with a cleansing solution. After cleansing the area, it is often gently packed with a gauze or another wound dressing. Once it is packed, it will be covered with gauze and tape or some other type of wound dressing.  °AFTER THE PROCEDURE  °· Often, you will be allowed to go home right after the procedure. °· You may be given antibiotic medicine to prevent or heal an infection. °· If the area was packed with gauze or some other wound dressing, you will likely need to come back in 1 to 2 days to get it removed. °· The area should heal in about 14 days. °  °This information is not intended to replace advice given to you by your health care provider. Make sure you discuss any questions you have with your health care provider. °  °Document Released: 05/13/2001 Document Revised: 05/18/2012 Document Reviewed: 01/12/2012 °Elsevier Interactive Patient Education ©2016 [employer] ° °

## 2016-03-29 NOTE — ED Notes (Signed)
Patient c/o infection to left hand. Patient has swelling, redness, and redness to left hand. Per patient started as a white pimple on left index finger in which he squeezed. Patient states some serosanguinous drainage from finger. Hand warm to touch. Denies any fevers.

## 2016-03-29 NOTE — ED Provider Notes (Signed)
CSN: [id number]     Arrival date & time 03/29/16  1656 History   First MD Initiated Contact with Patient 03/29/16 1715     Chief Complaint  Patient presents with  . Cellulitis     (Consider location/radiation/quality/duration/timing/severity/associated sxs/prior Treatment) HPI   40 year old male with pain and swelling to left index finger. Onset about 2 days ago. Initially thought he had a small pimple in this area. He tried squeezing it and had minimal yellowish/bloody drainage. Pain, redness and swelling has been progressing since then. Denies any discrete trauma to the finger. No fevers or chills. No known history of diabetes mellitus or other immunocompromising factors.   Past Medical History  Diagnosis Date  . Asthma    History reviewed. No pertinent past surgical history. Family History  Problem Relation Age of Onset  . Cancer Mother   . Stroke Father   . Seizures Father   . Cancer Other    Social History  Substance Use Topics  . Smoking status: Current Every Day Smoker -- 1.00 packs/day for 15 years    Types: Cigarettes  . Smokeless tobacco: Never Used  . Alcohol Use: 1.8 oz/week    3 Cans of beer per week     Comment: occasionally    Review of Systems  All systems reviewed and negative, other than as noted in HPI.   Allergies  Hydrocodone  Home Medications   Prior to Admission medications   Medication Sig Start Date End Date Taking? Authorizing Provider  clindamycin (CLEOCIN) 150 MG capsule Take 2 capsules (300 mg total) by mouth 3 (three) times daily. 03/29/16   Raeford Razor, MD   BP 134/85 mmHg  Pulse 98  Temp(Src) 98.3 F (36.8 C) (Oral)  Resp 18  Ht  (1.778 m)  Wt 140 lb (63.504 kg)  BMI 20.09 kg/m2  SpO2 96% Physical Exam  Constitutional: He appears well-developed and well-nourished. No distress.  HENT:  Head: Normocephalic and atraumatic.  Eyes: Conjunctivae are normal. Right eye exhibits no discharge. Left eye exhibits no discharge.   Neck: Neck supple.  Cardiovascular: Normal rate, regular rhythm and normal heart sounds.  Exam reveals no gallop and no friction rub.   No murmur heard. Pulmonary/Chest: Effort normal and breath sounds normal. No respiratory distress.  Abdominal: Soft. He exhibits no distension. There is no tenderness.  Musculoskeletal: He exhibits no edema or tenderness.  Small abscess to the dorsal aspect of left index finger. Fluctuant. There is proximal redness extending past the MP joint into the dorsal aspect of the hand. Can actively range the PIP and MP joints although some increased pain. Neurovascular intact.  Neurological: He is alert.  Skin: Skin is warm and dry.  Psychiatric: He has a normal mood and affect. His behavior is normal. Thought content normal.  Nursing note and vitals reviewed.   ED Course  Procedures (including critical care time)  INCISION AND DRAINAGE Performed by: Raeford Razor Consent: Verbal consent obtained. Risks and benefits: risks, benefits and alternatives were discussed Type: abscess  Body area: L index finger  Anesthesia: local infiltration  Incision was made with a scalpel.  Local anesthetic: lidocaine 1% w/o epinephrine  Anesthetic total: 1 ml  Complexity: complex Blunt dissection to break up loculations  Drainage: purulent  Drainage amount: small  Packing material: none  Patient tolerance: Patient tolerated the procedure well with no immediate complications.     Labs Review Labs Reviewed - No data to display  Imaging Review Dg Finger Index Left  03/29/2016  CLINICAL DATA:  40 year old male with small abscess or cellulitis in the a left second finger. Evaluate for possible foreign body. EXAM: LEFT INDEX FINGER 2+V COMPARISON:  No priors. FINDINGS: There is no evidence of fracture or dislocation. There is no evidence of arthropathy or other focal bone abnormality. Soft tissues are diffusely swollen. IMPRESSION: 1. No retained radiopaque  foreign body. 2. Diffuse soft tissue swelling in the left second digit, without underlying bony abnormality. Electronically Signed   By: Trudie Reedaniel  Entrikin M.D.   On: 03/29/2016 17:58   I have personally reviewed and evaluated these images and lab results as part of my medical decision-making.   EKG Interpretation None      MDM   Final diagnoses:  Abscess of finger, left  Cellulitis of hand    40yM with abscess to dorsal L index finger. Mild swelling/erythema proximally onto hand concerning for developing cellulitis. No FB on XR. I&D'd. Continued abx. Wound care and return precautions discussed. Tetanus updated.     Raeford RazorStephen Jama Krichbaum, MD 04/07/16 [phone]

## 2016-03-29 NOTE — ED Notes (Signed)
Pt alert & oriented x4, stable gait. Patient given discharge instructions, paperwork & prescription(s). Patient  instructed to stop at the registration desk to finish any additional paperwork. Patient verbalized understanding. Pt left department w/ no further questions. 

## 2016-06-21 ENCOUNTER — Encounter (HOSPITAL_COMMUNITY): Payer: Self-pay | Admitting: Emergency Medicine

## 2016-06-21 ENCOUNTER — Emergency Department (HOSPITAL_COMMUNITY)
Admission: EM | Admit: 2016-06-21 | Discharge: 2016-06-21 | Disposition: A | Discharge status: Home / Self Care | Payer: Self-pay | Attending: Emergency Medicine | Admitting: Emergency Medicine

## 2016-06-21 DIAGNOSIS — W57XXXA Bitten or stung by nonvenomous insect and other nonvenomous arthropods, initial encounter: Secondary | ICD-10-CM | POA: Insufficient documentation

## 2016-06-21 DIAGNOSIS — J45909 Unspecified asthma, uncomplicated: Secondary | ICD-10-CM | POA: Insufficient documentation

## 2016-06-21 DIAGNOSIS — Y999 Unspecified external cause status: Secondary | ICD-10-CM | POA: Insufficient documentation

## 2016-06-21 DIAGNOSIS — S30860A Insect bite (nonvenomous) of lower back and pelvis, initial encounter: Secondary | ICD-10-CM | POA: Insufficient documentation

## 2016-06-21 DIAGNOSIS — F17210 Nicotine dependence, cigarettes, uncomplicated: Secondary | ICD-10-CM | POA: Insufficient documentation

## 2016-06-21 DIAGNOSIS — Y92009 Unspecified place in unspecified non-institutional (private) residence as the place of occurrence of the external cause: Secondary | ICD-10-CM | POA: Insufficient documentation

## 2016-06-21 DIAGNOSIS — Y9389 Activity, other specified: Secondary | ICD-10-CM | POA: Insufficient documentation

## 2016-06-21 MED ORDER — IBUPROFEN 600 MG PO TABS: 600.0000 mg | ORAL_TABLET | Freq: Four times a day (QID) | ORAL | Status: DC | PRN

## 2016-06-21 MED ORDER — OXYCODONE-ACETAMINOPHEN 5-325 MG PO TABS: 1.0000 | ORAL_TABLET | ORAL | Status: DC | PRN

## 2016-06-21 MED ORDER — OXYCODONE-ACETAMINOPHEN 5-325 MG PO TABS
1.0000 | ORAL_TABLET | Freq: Once | ORAL | Status: AC
  Administered 2016-06-21: 1 via ORAL
  Filled 2016-06-21: qty 1

## 2016-06-21 MED ORDER — SULFAMETHOXAZOLE-TRIMETHOPRIM 800-160 MG PO TABS: 1.0000 | ORAL_TABLET | Freq: Two times a day (BID) | ORAL | Status: AC

## 2016-06-21 NOTE — ED Provider Notes (Signed)
CSN: [id number]     Arrival date & time 06/21/16  1829 History   First MD Initiated Contact with Patient 06/21/16 1837     Chief Complaint  Patient presents with  . Insect Bite     (Consider location/radiation/quality/duration/timing/severity/associated sxs/prior Treatment) The history is provided by the patient.   James Baker is a 40 y.o. male presenting with a suspected insect bite (? Brown recluse) at his sacrum.  Two days ago he had to crawl into his water well house to prime the pump which stopped working. When he got out there was a "sore" place on his sacral area that has become increasingly painful, red and swollen.  He did not specifically recall being bit or stung, but knows there were insects in the well house. He denies fevers, chills, abdominal pain, nausea or vomiting.  He tried squeezing the site yesterday which worsened the pain.  He has found no other alleviators.    Past Medical History  Diagnosis Date  . Asthma    History reviewed. No pertinent past surgical history. Family History  Problem Relation Age of Onset  . Cancer Mother   . Stroke Father   . Seizures Father   . Cancer Other    Social History  Substance Use Topics  . Smoking status: Current Every Day Smoker -- 1.00 packs/day for 15 years    Types: Cigarettes  . Smokeless tobacco: Never Used  . Alcohol Use: 1.8 oz/week    3 Cans of beer per week     Comment: occasionally    Review of Systems  Constitutional: Negative for fever and chills.  Respiratory: Negative for shortness of breath and wheezing.   Gastrointestinal: Negative for nausea, vomiting and abdominal pain.  Skin: Positive for color change and wound.  Neurological: Negative for numbness.      Allergies  Hydrocodone  Home Medications   Prior to Admission medications   Medication Sig Start Date End Date Taking? Authorizing Provider  clindamycin (CLEOCIN) 150 MG capsule Take 2 capsules (300 mg total) by mouth 3 (three) times  daily. 03/29/16   Raeford Razor, MD  ibuprofen (ADVIL,MOTRIN) 600 MG tablet Take 1 tablet (600 mg total) by mouth every 6 (six) hours as needed. 06/21/16   Burgess Amor, PA-C  oxyCODONE-acetaminophen (PERCOCET/ROXICET) 5-325 MG tablet Take 1 tablet by mouth every 4 (four) hours as needed. 06/21/16   Burgess Amor, PA-C  sulfamethoxazole-trimethoprim (BACTRIM DS,SEPTRA DS) 800-160 MG tablet Take 1 tablet by mouth 2 (two) times daily. 06/21/16 06/28/16  Burgess Amor, PA-C   BP 134/98 mmHg  Pulse 88  Temp(Src) 98.2 F (36.8 C) (Oral)  Resp 18  Ht 5\' 10"  (1.778 m)  Wt 61.236 kg  BMI 19.37 kg/m2  SpO2 100% Physical Exam  Constitutional: He is oriented to person, place, and time. He appears well-developed and well-nourished.  HENT:  Head: Normocephalic.  Cardiovascular: Normal rate.   Pulmonary/Chest: Effort normal.  Neurological: He is alert and oriented to person, place, and time. No sensory deficit.  Skin:  Raised 1 cm nodule with yellow border and small central dark eschar.  Induration without fluctuance or drainage.  1 cm surrounding erythema.  Exquisitely ttp, located on sacrum.  Not invading pilonidal space. No red streaking.      ED Course  Procedures (including critical care time)  Informal bedside US performed with no pus pocket identified. Labs Review Labs Reviewed - No data to display  Imaging Review No results found. I have personally reviewed and  evaluated these images and lab results as part of my medical decision-making.   EKG Interpretation None      MDM   Final diagnoses:  Infected insect bite    Pt with raised infected lesion at sacrum,  Possibly infected insect bite, given recent exposure to dark enclosed space,  Possible brown recluse bite.  He was placed on bactrim, first dose given here.  Ibuprofen for pain and inflammation.  Few oxycodone for use the next 2 days until abx starts to improve.  Warm soaks.  Advised recheck by pcp or return here for any worsening or  spreading sx or if no changes beyond 48 hours of abx tx.    Burgess Amor, PA-C 06/21/16 2141  Margarita Grizzle, MD 06/21/16 [phone]

## 2016-06-21 NOTE — ED Notes (Signed)
Possible insect bite to buttock.  Rates pain 10/10.

## 2016-06-21 NOTE — Discharge Instructions (Signed)
You are being treated for  a skin infection which may have started as a simple pimple, but is also possible that this is the result of a brown recluse spider bite you have been given information about this possibility with the attached paperwork.  As discussed complete your entire course of antibiotics.  Do not drive within 4 hours of taking the medication prescribed for pain.  Apply warm soaks to the bite site for 10-15 minutes several times daily.  Do not squeeze the site.  Keep covered as long as it continues draining.  Get rechecked by your primary doctor or return here for any worsening symptoms as outlined below.

## 2017-04-27 IMAGING — DX DG FINGER INDEX 2+V*L*
3 series · 3 of 3 positions shown · non-contrast
Comparison: No priors.

CLINICAL DATA: 40-year-old male with small abscess or cellulitis in
the a left second finger. Evaluate for possible foreign body.

EXAM:
LEFT INDEX FINGER 2+V

[finger ap]
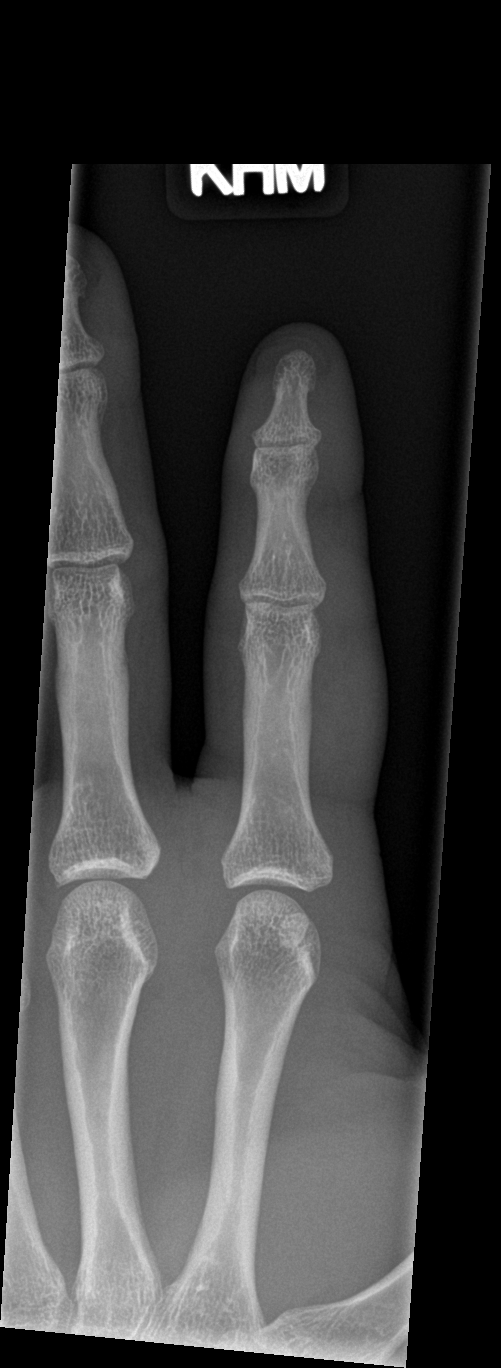

[finger obl]
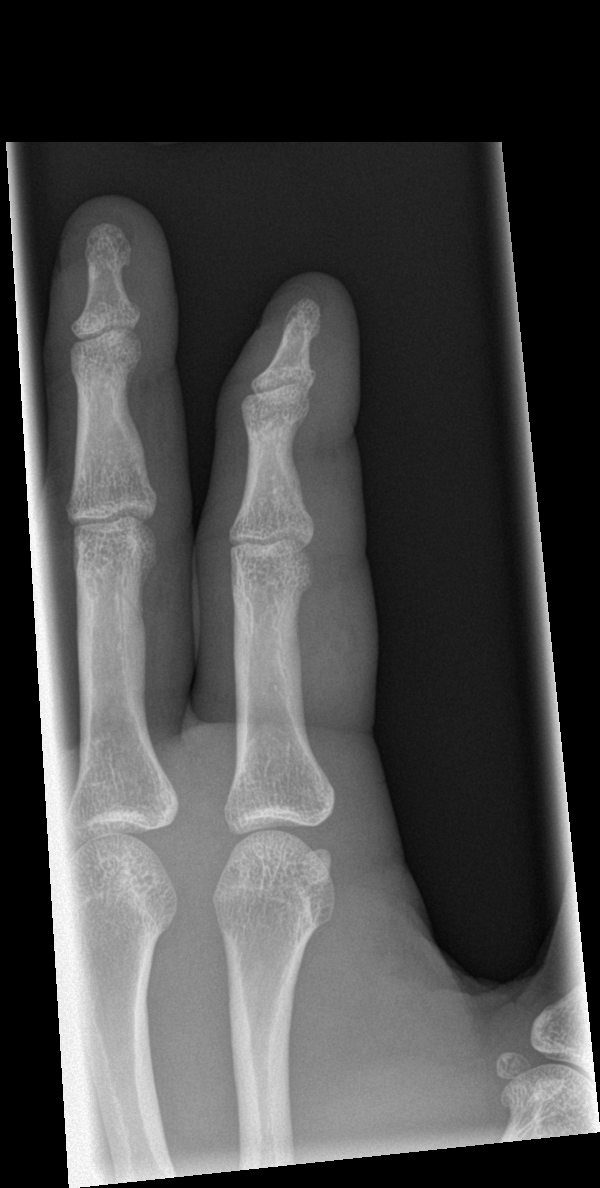

[finger lat]
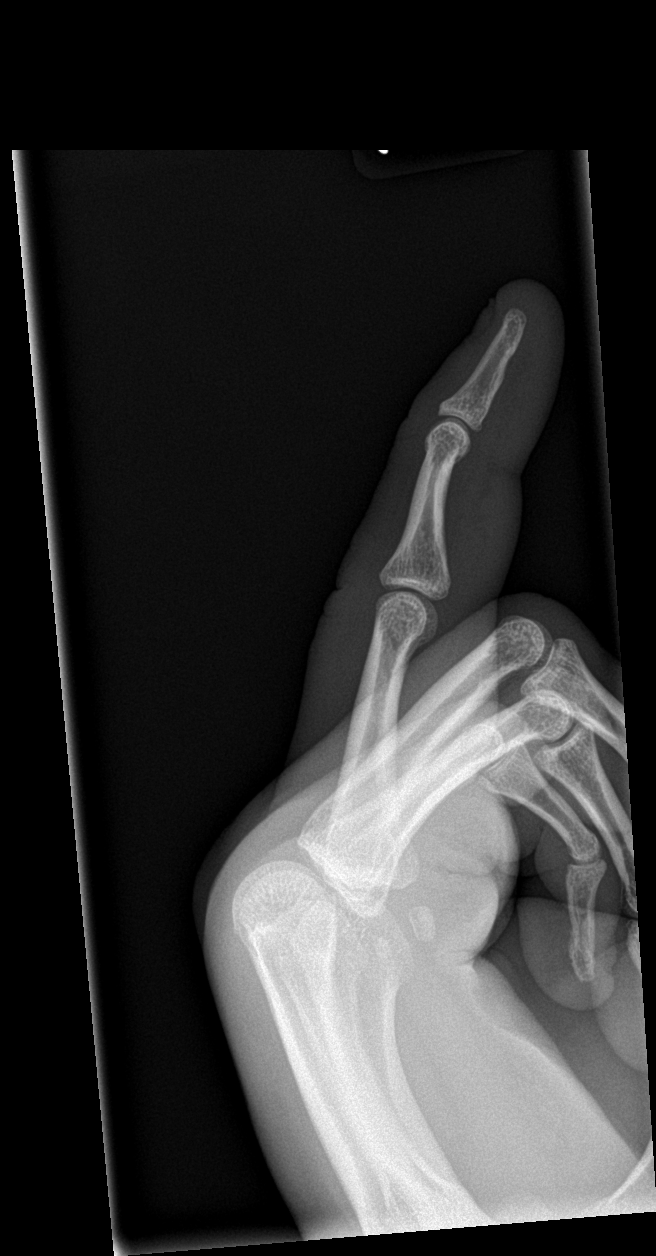

[3 of 3 positions shown; findings below may reference images not displayed]

FINDINGS: There is no evidence of fracture or dislocation. There is no
evidence of arthropathy or other focal bone abnormality. Soft
tissues are diffusely swollen.
IMPRESSION: 1. No retained radiopaque foreign body.
2. Diffuse soft tissue swelling in the left second digit, without
underlying bony abnormality.

## 2018-03-30 ENCOUNTER — Ambulatory Visit: Payer: Self-pay | Admitting: Family Medicine

## 2018-03-30 NOTE — Progress Notes (Deleted)
Subjective: ZO:XWRUEAVWU care, *** HPI: James Baker is a 42 y.o. male presenting to clinic today for:  1. ***  Past Medical History:  Diagnosis Date  . Asthma    No past surgical history on file. Social History   Socioeconomic History  . Marital status: Single    Spouse name: Not on file  . Number of children: Not on file  . Years of education: Not on file  . Highest education level: Not on file  Occupational History  . Not on file  Social Needs  . Financial resource strain: Not on file  . Food insecurity:    Worry: Not on file    Inability: Not on file  . Transportation needs:    Medical: Not on file    Non-medical: Not on file  Tobacco Use  . Smoking status: Current Every Day Smoker    Packs/day: 1.00    Years: 15.00    Pack years: 15.00    Types: Cigarettes  . Smokeless tobacco: Never Used  Substance and Sexual Activity  . Alcohol use: Yes    Alcohol/week: 1.8 oz    Types: 3 Cans of beer per week    Comment: occasionally  . Drug use: No  . Sexual activity: Never  Lifestyle  . Physical activity:    Days per week: Not on file    Minutes per session: Not on file  . Stress: Not on file  Relationships  . Social connections:    Talks on phone: Not on file    Gets together: Not on file    Attends religious service: Not on file    Active member of club or organization: Not on file    Attends meetings of clubs or organizations: Not on file    Relationship status: Not on file  . Intimate partner violence:    Fear of current or ex partner: Not on file    Emotionally abused: Not on file    Physically abused: Not on file    Forced sexual activity: Not on file  Other Topics Concern  . Not on file  Social History Narrative  . Not on file   No outpatient medications have been marked as taking for the 03/30/18 encounter (Appointment) with Raliegh Ip, DO.   Family History  Problem Relation Age of Onset  . Cancer Mother   . Stroke Father   .  Seizures Father   . Cancer Other    Allergies  Allergen Reactions  . Hydrocodone Itching     Health Maintenance: ***  Flu Vaccine: {YES/NO/WILD JWJXB:14782}  Tdap Vaccine: {YES/NO/WILD NFAOZ:30865}  - every 38yrs - (<3 lifetime doses or unknown): all wounds -- look up need for Tetanus IG - (>=3 lifetime doses): clean/minor wound if >27yrs from previous; all other wounds if >84yrs from previous Zoster Vaccine: {YES/NO/WILD CARDS:18581} (those >50yo, once) Pneumonia Vaccine: {YES/NO/WILD HQION:62952} (those w/ risk factors) - (<49yr) Both: Immunocompromised, cochlear implant, CSF leak, asplenic, sickle cell, Chronic Renal Failure - (<33yr) PPSV-23 only: Heart dz, lung disease, DM, tobacco abuse, alcoholism, cirrhosis/liver disease. - (>107yr): PPSV13 then PPSV23 in 6-12mths;  - (>76yr): repeat PPSV23 once if pt received prior to 40yo and 31yrs have passed  ROS: Per HPI  Objective: Office vital signs reviewed. There were no vitals taken for this visit.  Physical Examination:  General: Awake, alert, *** nourished, No acute distress HEENT: Normal    Neck: No masses palpated. No lymphadenopathy    Ears: Tympanic membranes intact,  normal light reflex, no erythema, no bulging    Eyes: PERRLA, extraocular movement in tact, sclera ***    Nose: nasal turbinates moist, *** nasal discharge    Throat: moist mucus membranes, no erythema, *** tonsillar exudate.  Airway is patent Cardio: regular rate and rhythm, S1S2 heard, no murmurs appreciated Pulm: clear to auscultation bilaterally, no wheezes, rhonchi or rales; normal work of breathing on room air GI: soft, non-tender, non-distended, bowel sounds present x4, no hepatomegaly, no splenomegaly, no masses GU: external vaginal tissue ***, cervix ***, *** punctate lesions on cervix appreciated, *** discharge from cervical os, *** bleeding, *** cervical motion tenderness, *** abdominal/ adnexal masses Extremities: warm, well perfused, No edema,  cyanosis or clubbing; +*** pulses bilaterally MSK: *** gait and *** station Skin: dry; intact; no rashes or lesions Neuro: *** Strength and light touch sensation grossly intact, *** DTRs ***/4  Assessment/ Plan: 42 y.o. male   No problem-specific Assessment & Plan notes found for this encounter.   Raliegh Ip, DO [facility] [phone]

## 2019-08-01 ENCOUNTER — Other Ambulatory Visit: Payer: Self-pay

## 2019-08-01 ENCOUNTER — Emergency Department (HOSPITAL_COMMUNITY): Admission: EM | Admit: 2019-08-01 | Discharge: 2019-08-01 | Discharge status: Against Medical Advice | Payer: Self-pay

## 2019-08-01 NOTE — ED Notes (Signed)
Called x 2 to triage w/ no answer

## 2021-05-08 ENCOUNTER — Emergency Department (HOSPITAL_COMMUNITY)
Admission: EM | Admit: 2021-05-08 | Discharge: 2021-05-09 | Disposition: A | Discharge status: Home / Self Care | Payer: Self-pay | Attending: Emergency Medicine | Admitting: Emergency Medicine

## 2021-05-08 ENCOUNTER — Other Ambulatory Visit: Payer: Self-pay

## 2021-05-08 ENCOUNTER — Encounter (HOSPITAL_COMMUNITY): Payer: Self-pay

## 2021-05-08 DIAGNOSIS — B86 Scabies: Secondary | ICD-10-CM | POA: Insufficient documentation

## 2021-05-08 DIAGNOSIS — J45909 Unspecified asthma, uncomplicated: Secondary | ICD-10-CM | POA: Insufficient documentation

## 2021-05-08 DIAGNOSIS — F17210 Nicotine dependence, cigarettes, uncomplicated: Secondary | ICD-10-CM | POA: Insufficient documentation

## 2021-05-08 NOTE — ED Triage Notes (Signed)
Pt to er, pt states he doesn't do any recreational drugs, states that he is here for white and black bugs on his person, states that he can feel them crawling under his skin and tries to pick them out.  Pt has wounds on his arms and legs.  Pt states that he has had them for the past couple of days.

## 2021-05-09 MED ORDER — PERMETHRIN 5 % EX CREA: TOPICAL_CREAM | CUTANEOUS | 0 refills | Status: DC

## 2021-05-09 NOTE — ED Provider Notes (Signed)
AP-EMERGENCY DEPT [facility] Emergency Department Provider Note MRN:  [id number]  Arrival date & time: 05/09/21     Chief Complaint   Insect Bite   History of Present Illness   James Baker is a 45 y.o. year-old male with no pertinent past medical presenting to the ED with chief complaint of insect bite.  Rash to the legs, hands, arms, chest, back for the past 2 or 3 days.  Very itchy.  Thinks he is being bitten by bugs.  Denies any IV drug use.  No fever, no other complaints.  Review of Systems  A problem-focused ROS was performed. Positive for rash.  Patient denies fever.  Patient's Health History    Past Medical History:  Diagnosis Date  . Asthma     History reviewed. No pertinent surgical history.  Family History  Problem Relation Age of Onset  . Cancer Mother   . Stroke Father   . Seizures Father   . Cancer Other     Social History   Socioeconomic History  . Marital status: Single    Spouse name: Not on file  . Number of children: Not on file  . Years of education: Not on file  . Highest education level: Not on file  Occupational History  . Not on file  Tobacco Use  . Smoking status: Current Every Day Smoker    Packs/day: 1.00    Years: 15.00    Pack years: 15.00    Types: Cigarettes  . Smokeless tobacco: Never Used  Vaping Use  . Vaping Use: Never used  Substance and Sexual Activity  . Alcohol use: Yes    Alcohol/week: 3.0 standard drinks    Types: 3 Cans of beer per week    Comment: occasionally  . Drug use: No  . Sexual activity: Never  Other Topics Concern  . Not on file  Social History Narrative  . Not on file   Social Determinants of Health   Financial Resource Strain: Not on file  Food Insecurity: Not on file  Transportation Needs: Not on file  Physical Activity: Not on file  Stress: Not on file  Social Connections: Not on file  Intimate Partner Violence: Not on file     Physical Exam   Vitals:   05/08/21 2246  05/08/21 2338  BP: (!) 143/88 129/79  Pulse: 86 72  Resp: 18 20  Temp: 98.5 F (36.9 C) 98.1 F (36.7 C)  SpO2: 99% 99%    CONSTITUTIONAL: Well-appearing, NAD NEURO:  Alert and oriented x 3, no focal deficits EYES:  eyes equal and reactive ENT/NECK:  no LAD, no JVD CARDIO: Regular rate, well-perfused, normal S1 and S2 PULM:  CTAB no wheezing or rhonchi GI/GU:  normal bowel sounds, non-distended, non-tender MSK/SPINE:  No gross deformities, no edema SKIN: Excoriated erythematous macular rash to the arms, legs, torso, back PSYCH:  Appropriate speech and behavior  *Additional and/or pertinent findings included in MDM below  Diagnostic and Interventional Summary    EKG Interpretation  Date/Time:    Ventricular Rate:    PR Interval:    QRS Duration:   QT Interval:    QTC Calculation:   R Axis:     Text Interpretation:        Labs Reviewed - No data to display  No orders to display    Medications - No data to display   Procedures  /  Critical Care Procedures  ED Course and Medical Decision Making  I have  reviewed the triage vital signs, the nursing notes, and pertinent available records from the EMR.  Listed above are laboratory and imaging tests that I personally ordered, reviewed, and interpreted and then considered in my medical decision making (see below for details).  Suspect scabies versus bedbugs, will cover with permethrin.  No signs of emergent process, appropriate for discharge per       Elmer Sow. Pilar Plate, MD [facility] Emergency Medicine [facility] [email or web address]  Final Clinical Impressions(s) / ED Diagnoses     ICD-10-CM   1. Scabies  B86     ED Discharge Orders         Ordered    permethrin (ELIMITE) 5 % cream  Status:  Discontinued        05/09/21 0044    permethrin (ELIMITE) 5 % cream        05/09/21 0045           Discharge Instructions Discussed with and Provided to Patient:     Discharge Instructions      You were evaluated in the Emergency Department and after careful evaluation, we did not find any emergent condition requiring admission or further testing in the hospital.  Your exam/testing today was overall reassuring.  Symptoms seem to be due to scabies.  Please take the medication as directed.  You should also wash your close and bed sheets with hot water.  Please return to the Emergency Department if you experience any worsening of your condition.  Thank you for allowing [place] to be a part of your care.       Sabas Sous, MD 05/09/21 [phone]

## 2021-05-09 NOTE — Discharge Instructions (Addendum)
You were evaluated in the Emergency Department and after careful evaluation, we did not find any emergent condition requiring admission or further testing in the hospital.  Your exam/testing today was overall reassuring.  Symptoms seem to be due to scabies.  Please take the medication as directed.  You should also wash your close and bed sheets with hot water.  Please return to the Emergency Department if you experience any worsening of your condition.  Thank you for allowing [place] to be a part of your care.

## 2022-07-20 ENCOUNTER — Inpatient Hospital Stay (HOSPITAL_COMMUNITY)
Admission: EM | Admit: 2022-07-20 | Discharge: 2022-08-02 | DRG: 853 | Discharge status: Against Medical Advice | Payer: 59 | Attending: Internal Medicine | Admitting: Internal Medicine

## 2022-07-20 ENCOUNTER — Emergency Department (HOSPITAL_COMMUNITY): Payer: Self-pay

## 2022-07-20 ENCOUNTER — Inpatient Hospital Stay (HOSPITAL_COMMUNITY): Payer: Self-pay

## 2022-07-20 ENCOUNTER — Other Ambulatory Visit: Payer: Self-pay

## 2022-07-20 ENCOUNTER — Encounter (HOSPITAL_COMMUNITY): Payer: Self-pay | Admitting: *Deleted

## 2022-07-20 DIAGNOSIS — R54 Age-related physical debility: Secondary | ICD-10-CM | POA: Diagnosis present

## 2022-07-20 DIAGNOSIS — Z20822 Contact with and (suspected) exposure to covid-19: Secondary | ICD-10-CM | POA: Diagnosis present

## 2022-07-20 DIAGNOSIS — L0390 Cellulitis, unspecified: Secondary | ICD-10-CM | POA: Diagnosis present

## 2022-07-20 DIAGNOSIS — A419 Sepsis, unspecified organism: Principal | ICD-10-CM | POA: Diagnosis present

## 2022-07-20 DIAGNOSIS — Z5329 Procedure and treatment not carried out because of patient's decision for other reasons: Secondary | ICD-10-CM | POA: Diagnosis present

## 2022-07-20 DIAGNOSIS — L02413 Cutaneous abscess of right upper limb: Secondary | ICD-10-CM | POA: Diagnosis present

## 2022-07-20 DIAGNOSIS — F17210 Nicotine dependence, cigarettes, uncomplicated: Secondary | ICD-10-CM | POA: Diagnosis present

## 2022-07-20 DIAGNOSIS — Z809 Family history of malignant neoplasm, unspecified: Secondary | ICD-10-CM

## 2022-07-20 DIAGNOSIS — L03113 Cellulitis of right upper limb: Secondary | ICD-10-CM | POA: Diagnosis not present

## 2022-07-20 DIAGNOSIS — M60000 Infective myositis, unspecified right arm: Secondary | ICD-10-CM | POA: Diagnosis present

## 2022-07-20 DIAGNOSIS — J45909 Unspecified asthma, uncomplicated: Secondary | ICD-10-CM | POA: Diagnosis not present

## 2022-07-20 DIAGNOSIS — Z720 Tobacco use: Secondary | ICD-10-CM | POA: Diagnosis present

## 2022-07-20 DIAGNOSIS — F1510 Other stimulant abuse, uncomplicated: Secondary | ICD-10-CM | POA: Diagnosis present

## 2022-07-20 DIAGNOSIS — W208XXA Other cause of strike by thrown, projected or falling object, initial encounter: Secondary | ICD-10-CM | POA: Diagnosis present

## 2022-07-20 DIAGNOSIS — Z823 Family history of stroke: Secondary | ICD-10-CM

## 2022-07-20 DIAGNOSIS — Z885 Allergy status to narcotic agent status: Secondary | ICD-10-CM

## 2022-07-20 DIAGNOSIS — E871 Hypo-osmolality and hyponatremia: Secondary | ICD-10-CM | POA: Diagnosis not present

## 2022-07-20 DIAGNOSIS — R6520 Severe sepsis without septic shock: Secondary | ICD-10-CM | POA: Diagnosis present

## 2022-07-20 DIAGNOSIS — Z79899 Other long term (current) drug therapy: Secondary | ICD-10-CM

## 2022-07-20 DIAGNOSIS — F1210 Cannabis abuse, uncomplicated: Secondary | ICD-10-CM | POA: Diagnosis present

## 2022-07-20 DIAGNOSIS — M726 Necrotizing fasciitis: Secondary | ICD-10-CM | POA: Diagnosis present

## 2022-07-20 LAB — LACTIC ACID, PLASMA
Lactic Acid, Venous: 2 mmol/L (ref 0.5–1.9)
Lactic Acid, Venous: 1.1 mmol/L (ref 0.5–1.9)

## 2022-07-20 LAB — CBC WITH DIFFERENTIAL/PLATELET
Abs Immature Granulocytes: 0.25 10*3/uL — ABNORMAL HIGH (ref 0.00–0.07)
Basophils Absolute: 0.1 10*3/uL (ref 0.0–0.1)
Basophils Relative: 0 %
Eosinophils Absolute: 0.2 10*3/uL (ref 0.0–0.5)
Eosinophils Relative: 1 %
HCT: 41.2 % (ref 39.0–52.0)
Hemoglobin: 13.4 g/dL (ref 13.0–17.0)
Immature Granulocytes: 1 %
Lymphocytes Relative: 17 %
Lymphs Abs: 3.7 10*3/uL (ref 0.7–4.0)
MCH: 26.9 pg (ref 26.0–34.0)
MCHC: 32.5 g/dL (ref 30.0–36.0)
MCV: 82.6 fL (ref 80.0–100.0)
Monocytes Absolute: 2.1 10*3/uL — ABNORMAL HIGH (ref 0.1–1.0)
Monocytes Relative: 9 %
Neutro Abs: 15.7 10*3/uL — ABNORMAL HIGH (ref 1.7–7.7)
Neutrophils Relative %: 72 %
Platelets: 346 10*3/uL (ref 150–400)
RBC: 4.99 MIL/uL (ref 4.22–5.81)
RDW: 15.7 % — ABNORMAL HIGH (ref 11.5–15.5)
WBC: 22.1 10*3/uL — ABNORMAL HIGH (ref 4.0–10.5)
nRBC: 0 % (ref 0.0–0.2)

## 2022-07-20 LAB — RESP PANEL BY RT-PCR (FLU A&B, COVID) ARPGX2
Influenza A by PCR: NEGATIVE
Influenza B by PCR: NEGATIVE
SARS Coronavirus 2 by RT PCR: NEGATIVE

## 2022-07-20 LAB — COMPREHENSIVE METABOLIC PANEL
ALT: 13 U/L (ref 0–44)
AST: 26 U/L (ref 15–41)
Albumin: 3.2 g/dL — ABNORMAL LOW (ref 3.5–5.0)
Alkaline Phosphatase: 64 U/L (ref 38–126)
Anion gap: 9 (ref 5–15)
BUN: 13 mg/dL (ref 6–20)
CO2: 30 mmol/L (ref 22–32)
Calcium: 9.1 mg/dL (ref 8.9–10.3)
Chloride: 91 mmol/L — ABNORMAL LOW (ref 98–111)
Creatinine, Ser: 0.97 mg/dL (ref 0.61–1.24)
GFR, Estimated: >60 mL/min (ref >60)
Glucose, Bld: 122 mg/dL — ABNORMAL HIGH (ref 70–99)
Potassium: 3.8 mmol/L (ref 3.5–5.1)
Sodium: 130 mmol/L — ABNORMAL LOW (ref 135–145)
Total Bilirubin: 1.1 mg/dL (ref 0.3–1.2)
Total Protein: 7.2 g/dL (ref 6.5–8.1)

## 2022-07-20 LAB — APTT: aPTT: 30 seconds (ref 24–36)

## 2022-07-20 LAB — RAPID URINE DRUG SCREEN, HOSP PERFORMED
Amphetamines: POSITIVE — AB
Barbiturates: NOT DETECTED
Benzodiazepines: POSITIVE — AB
Cocaine: NOT DETECTED
Opiates: NOT DETECTED
Tetrahydrocannabinol: POSITIVE — AB

## 2022-07-20 LAB — PROTIME-INR
INR: 1.2 (ref 0.8–1.2)
Prothrombin Time: 14.6 seconds (ref 11.4–15.2)

## 2022-07-20 MED ORDER — NICOTINE 14 MG/24HR TD PT24
14.0000 mg | MEDICATED_PATCH | Freq: Every day | TRANSDERMAL | Status: DC
  Administered 2022-07-20 – 2022-08-01 (×12): 14 mg via TRANSDERMAL
  Filled 2022-07-20 (×12): qty 1

## 2022-07-20 MED ORDER — LORAZEPAM 1 MG PO TABS
1.0000 mg | ORAL_TABLET | Freq: Once | ORAL | Status: AC
Start: 2022-07-20 — End: 2022-07-20
  Administered 2022-07-20: 1 mg via ORAL
  Filled 2022-07-20: qty 1

## 2022-07-20 MED ORDER — MORPHINE SULFATE (PF) 2 MG/ML IV SOLN
2.0000 mg | INTRAVENOUS | Status: DC | PRN
  Administered 2022-07-20 – 2022-07-21 (×2): 2 mg via INTRAVENOUS
  Filled 2022-07-20 (×2): qty 1

## 2022-07-20 MED ORDER — ALBUTEROL SULFATE (2.5 MG/3ML) 0.083% IN NEBU
2.5000 mg | INHALATION_SOLUTION | RESPIRATORY_TRACT | Status: DC | PRN
  Administered 2022-07-27: 2.5 mg via RESPIRATORY_TRACT
  Filled 2022-07-20: qty 3

## 2022-07-20 MED ORDER — SODIUM CHLORIDE 0.9 % IV BOLUS
500.0000 mL | Freq: Once | INTRAVENOUS | Status: AC
  Administered 2022-07-20: 500 mL via INTRAVENOUS

## 2022-07-20 MED ORDER — SODIUM CHLORIDE 0.9 % IV SOLN
2.0000 g | INTRAVENOUS | Status: DC
  Administered 2022-07-20: 2 g via INTRAVENOUS
  Filled 2022-07-20: qty 20

## 2022-07-20 MED ORDER — KETOROLAC TROMETHAMINE 30 MG/ML IJ SOLN
30.0000 mg | Freq: Once | INTRAMUSCULAR | Status: AC
Start: 2022-07-20 — End: 2022-07-20
  Administered 2022-07-20: 30 mg via INTRAVENOUS
  Filled 2022-07-20: qty 1

## 2022-07-20 MED ORDER — IOHEXOL 300 MG/ML  SOLN
75.0000 mL | Freq: Once | INTRAMUSCULAR | Status: AC | PRN
  Administered 2022-07-20: 75 mL via INTRAVENOUS

## 2022-07-20 MED ORDER — METRONIDAZOLE 500 MG/100ML IV SOLN
500.0000 mg | Freq: Two times a day (BID) | INTRAVENOUS | Status: DC
  Administered 2022-07-20 – 2022-07-22 (×4): 500 mg via INTRAVENOUS
  Filled 2022-07-20 (×4): qty 100

## 2022-07-20 MED ORDER — SODIUM CHLORIDE 0.9 % IV BOLUS
2000.0000 mL | Freq: Once | INTRAVENOUS | Status: AC
  Administered 2022-07-20: 2000 mL via INTRAVENOUS

## 2022-07-20 MED ORDER — ALBUTEROL SULFATE (2.5 MG/3ML) 0.083% IN NEBU
2.5000 mg | INHALATION_SOLUTION | Freq: Once | RESPIRATORY_TRACT | Status: DC
  Filled 2022-07-20: qty 3

## 2022-07-20 MED ORDER — ALBUTEROL SULFATE (2.5 MG/3ML) 0.083% IN NEBU
2.5000 mg | INHALATION_SOLUTION | Freq: Once | RESPIRATORY_TRACT | Status: AC
  Administered 2022-07-20: 2.5 mg via RESPIRATORY_TRACT
  Filled 2022-07-20: qty 3

## 2022-07-20 MED ORDER — VANCOMYCIN HCL 1250 MG/250ML IV SOLN
1250.0000 mg | Freq: Once | INTRAVENOUS | Status: AC
  Administered 2022-07-20: 1250 mg via INTRAVENOUS
  Filled 2022-07-20: qty 250

## 2022-07-20 MED ORDER — SODIUM CHLORIDE 0.9 % IV SOLN: INTRAVENOUS | Status: DC

## 2022-07-20 MED ORDER — VANCOMYCIN HCL 750 MG/150ML IV SOLN
750.0000 mg | Freq: Two times a day (BID) | INTRAVENOUS | Status: DC
  Administered 2022-07-21: 750 mg via INTRAVENOUS
  Filled 2022-07-20 (×3): qty 150

## 2022-07-20 NOTE — ED Notes (Signed)
Patient updated by Dr. Estell Harpin and states he does not want to ride in an ambulance and will drive himself to [place]. This nurse explained the process of AMA and undergoing ED process at [facility]. Patient states his arm will hurt bouncing in an ambulance. Dr. Estell Harpin at bedside to speak with patient.

## 2022-07-20 NOTE — ED Notes (Signed)
Pt stating that he wants to leave AMA. Admitting MD paged, awaiting response

## 2022-07-20 NOTE — ED Provider Notes (Signed)
[facility] EMERGENCY DEPARTMENT Provider Note   CSN: [id number] Arrival date & time: 07/20/22  1532     History {Add pertinent medical, surgical, social history, OB history to HPI:1} Chief Complaint  Patient presents with   Arm Injury    James Baker is a 46 y.o. male.  Patient complains of swelling and redness to right upper arm.  His humerus was accidentally hit with a heavy object at work a few days ago and the swelling started the next day   Arm Injury      Home Medications Prior to Admission medications   Medication Sig Start Date End Date Taking? Authorizing Provider  clindamycin (CLEOCIN) 150 MG capsule Take 2 capsules (300 mg total) by mouth 3 (three) times daily. 03/29/16   Raeford Razor, MD  ibuprofen (ADVIL,MOTRIN) 600 MG tablet Take 1 tablet (600 mg total) by mouth every 6 (six) hours as needed. 06/21/16   Burgess Amor, PA-C  oxyCODONE-acetaminophen (PERCOCET/ROXICET) 5-325 MG tablet Take 1 tablet by mouth every 4 (four) hours as needed. 06/21/16   Burgess Amor, PA-C  permethrin (ELIMITE) 5 % cream Apply to affected area once 05/09/21   Sabas Sous, MD      Allergies    Hydrocodone    Review of Systems   Review of Systems  Physical Exam Updated Vital Signs BP 114/84   Pulse (!) 114   Temp 99.3 F (37.4 C) (Oral)   Resp (!) 23   Ht 5\' 10"  (1.778 m)   Wt 61.2 kg   SpO2 95%   BMI 19.37 kg/m  Physical Exam  ED Results / Procedures / Treatments   Labs (all labs ordered are listed, but only abnormal results are displayed) Labs Reviewed  LACTIC ACID, PLASMA - Abnormal; Notable for the following components:      Result Value   Lactic Acid, Venous 2.0 (*)    All other components within normal limits  COMPREHENSIVE METABOLIC PANEL - Abnormal; Notable for the following components:   Sodium 130 (*)    Chloride 91 (*)    Glucose, Bld 122 (*)    Albumin 3.2 (*)    All other components within normal limits  CBC WITH DIFFERENTIAL/PLATELET - Abnormal;  Notable for the following components:   WBC 22.1 (*)    RDW 15.7 (*)    Neutro Abs 15.7 (*)    Monocytes Absolute 2.1 (*)    Abs Immature Granulocytes 0.25 (*)    All other components within normal limits  RESP PANEL BY RT-PCR (FLU A&B, COVID) ARPGX2  CULTURE, BLOOD (ROUTINE X 2)  CULTURE, BLOOD (ROUTINE X 2)  PROTIME-INR  APTT  LACTIC ACID, PLASMA    EKG None  Radiology DG Humerus Right  Result Date: 07/20/2022 CLINICAL DATA:  Trauma. Right arm pain. Pipe fell on arm at work on Thursday, self tattoo few weeks ago. EXAM: RIGHT HUMERUS - 2+ VIEW COMPARISON:  None Available. FINDINGS: Cortical margins of the humerus are intact. There is no evidence of fracture or other focal bone lesions. Elbow and shoulder alignment are maintained. There is soft tissue gas overlying the mid upper aspect of the arm. Soft tissue edema is seen laterally. IMPRESSION: 1. No fracture or dislocation. 2. Moderate volume patchy soft tissue gas overlying the mid upper arm. In the absence of laceration, this is suspicious for necrotizing soft tissue infection. Overlying soft tissue edema. These results were called by telephone at the time of interpretation on 07/20/2022 at 4:56 pm to provider  Caydence Enck , who verbally acknowledged these results. Electronically Signed   By: Narda Rutherford M.D.   On: 07/20/2022 16:57    Procedures Procedures  {Document cardiac monitor, telemetry assessment procedure when appropriate:1}  Medications Ordered in ED Medications  cefTRIAXone (ROCEPHIN) 2 g in sodium chloride 0.9 % 100 mL IVPB (0 g Intravenous Stopped 07/20/22 1746)  metroNIDAZOLE (FLAGYL) IVPB 500 mg (has no administration in time range)  sodium chloride 0.9 % bolus 2,000 mL (2,000 mLs Intravenous New Bag/Given 07/20/22 1634)    ED Course/ Medical Decision Making/ A&P  X-ray of the right humerus suggested could be neck fast.  I spoke with orthopedics Dr. Charlann Boxer and he agrees with medicine admitting the patient on  broad-spectrum antibiotics and orthopedics will see the patient to.    CRITICAL CARE Performed by: Bethann Berkshire Total critical care time: 40 minutes Critical care time was exclusive of separately billable procedures and treating other patients. Critical care was necessary to treat or prevent imminent or life-threatening deterioration. Critical care was time spent personally by me on the following activities: development of treatment plan with patient and/or surrogate as well as nursing, discussions with consultants, evaluation of patient's response to treatment, examination of patient, obtaining history from patient or surrogate, ordering and performing treatments and interventions, ordering and review of laboratory studies, ordering and review of radiographic studies, pulse oximetry and re-evaluation of patient's condition.                          Medical Decision Making Amount and/or Complexity of Data Reviewed Labs: ordered. Radiology: ordered. ECG/medicine tests: ordered.  Risk Decision regarding hospitalization.   Cellulitis to right upper arm possible necrotizing fasciitis  {Document critical care time when appropriate:1} {Document review of labs and clinical decision tools ie heart score, Chads2Vasc2 etc:1}  {Document your independent review of radiology images, and any outside records:1} {Document your discussion with family members, caretakers, and with consultants:1} {Document social determinants of health affecting pt's care:1} {Document your decision making why or why not admission, treatments were needed:1} Final Clinical Impression(s) / ED Diagnoses Final diagnoses:  Cellulitis of right upper extremity    Rx / DC Orders ED Discharge Orders     None

## 2022-07-20 NOTE — H&P (Signed)
History and Physical    James Baker IEP:[id number] DOB: [birth year] DOA: 07/20/2022  PCP: Patient, No Pcp Per   Patient coming from: Home  I have personally briefly reviewed patient's old medical records in [facility]  Chief Complaint: Right arm pain  HPI: James Baker is a 46 y.o. male with medical history significant for Asthma.  Patient presented to ED with complaints of redness and pain to his right upper arm.  Symptoms started 3 days ago- 8/17 after a pipe fell on his right upper arm.  About 3 weeks ago, patient had 2 tattoos to the forearm over the same extremity.  He bought a tattoo gun and this tattoos on himself.  The second tattoo which is closer to his antecubital fossa developed an infection and became green and had yellowish-green post draining from it.  But reports this resolved before the pipe fell on his arm on Thursday.  He denies fever or chills.  He has no pain or swelling to any other joints or extremities.  He denies IV drug use, but reports he uses marijuana cocaine and abit of many things.  Denies alcohol use.  ED Course: Temperature 99.3.  Heart rate 92-124.  Respirate rate 16-23.  Blood pressure systolic 91-142.  O2 sats 91 to 96% on room air.  Leukocytosis of 22.1.  Lactic acid of 3.  Right UE x-ray-moderate volume patchy soft tissue gas overlying the mid upper arm, suspicious for necrotizing soft tissue infection. IV Ceftriaxone started. EDP talked with Dr. Charlann Baker, recommended admission to [facility] and broad spectum antibiotics., 2L bolus given.  Review of Systems: As per HPI all other systems reviewed and negative.  Past Medical History:  Diagnosis Date   Asthma     History reviewed. No pertinent surgical history.   reports that he has been smoking cigarettes. He has a 15.00 pack-year smoking history. He has never used smokeless tobacco. He reports current alcohol use of about 3.0 standard drinks of alcohol per week. He reports that he does not use  drugs.  Allergies  Allergen Reactions   Hydrocodone Itching    Family History  Problem Relation Age of Onset   Cancer Mother    Stroke Father    Seizures Father    Cancer Other     Prior to Admission medications   Medication Sig Start Date End Date Taking? Authorizing Provider  clindamycin (CLEOCIN) 150 MG capsule Take 2 capsules (300 mg total) by mouth 3 (three) times daily. 03/29/16   James Razor, MD  ibuprofen (ADVIL,MOTRIN) 600 MG tablet Take 1 tablet (600 mg total) by mouth every 6 (six) hours as needed. 06/21/16   James Amor, PA-C  oxyCODONE-acetaminophen (PERCOCET/ROXICET) 5-325 MG tablet Take 1 tablet by mouth every 4 (four) hours as needed. 06/21/16   James Amor, PA-C  permethrin (ELIMITE) 5 % cream Apply to affected area once 05/09/21   James Sous, MD    Physical Exam: Vitals:   07/20/22 1600 07/20/22 1605 07/20/22 1638 07/20/22 1700  BP:  (!) 118/99 114/84   Pulse:  (!) 124 (!) 109 (!) 114  Resp:  19 17 (!) 23  Temp:      TempSrc:      SpO2:  90% 95% 95%  Weight: 61.2 kg     Height: 5\' 10"  (1.778 m)       Constitutional: NAD, calm, comfortable Vitals:   07/20/22 1600 07/20/22 1605 07/20/22 1638 07/20/22 1700  BP:  (!) 118/99 114/84  Pulse:  (!) 124 (!) 109 (!) 114  Resp:  19 17 (!) 23  Temp:      TempSrc:      SpO2:  90% 95% 95%  Weight: 61.2 kg     Height: 5\' 10"  (1.778 m)      Eyes: PERRL, lids and conjunctivae normal ENMT: Mucous membranes are moist.   Neck: normal, supple, no masses, no thyromegaly Respiratory: Diffuse expiratory wheezing, Normal respiratory effort. No accessory muscle use.  Cardiovascular: Regular rate and rhythm, no murmurs / rubs / gallops. No extremity edema.  Abdomen: no tenderness, no masses palpated. No hepatosplenomegaly. Bowel sounds positive.  Musculoskeletal: no clubbing / cyanosis. No joint deformity upper and lower extremities.  Skin: Significant swelling to right upper extremity compared to left, with  tenderness warmth, and erythema, no open wounds.  Also tattoos to right forearm, tattoo just distal to antecubital fossa's is the newest and has mostly healed, except for a few scabs , no surrounding erythema or drainage or open wounds.   Neurologic: No apparent cranial nerve abnormality moving extremities spontaneously.  Psychiatric: Normal judgment and insight. Alert and oriented x 3. Normal mood.   Labs on Admission: I have personally reviewed following labs and imaging studies  CBC: Recent Labs  Lab 07/20/22 1633  WBC 22.1*  NEUTROABS 15.7*  HGB 13.4  HCT 41.2  MCV 82.6  PLT 123456   Basic Metabolic Panel: Recent Labs  Lab 07/20/22 1633  NA 130*  K 3.8  CL 91*  CO2 30  GLUCOSE 122*  BUN 13  CREATININE 0.97  CALCIUM 9.1   GFR: Estimated Creatinine Clearance: 84.1 mL/min (by C-G formula based on SCr of 0.97 mg/dL). Liver Function Tests: Recent Labs  Lab 07/20/22 1633  AST 26  ALT 13  ALKPHOS 64  BILITOT 1.1  PROT 7.2  ALBUMIN 3.2*   Coagulation Profile: Recent Labs  Lab 07/20/22 1633  INR 1.2   Radiological Exams on Admission: DG Humerus Right  Result Date: 07/20/2022 CLINICAL DATA:  Trauma. Right arm pain. Pipe fell on arm at work on Thursday, self tattoo few weeks ago. EXAM: RIGHT HUMERUS - 2+ VIEW COMPARISON:  None Available. FINDINGS: Cortical margins of the humerus are intact. There is no evidence of fracture or other focal bone lesions. Elbow and shoulder alignment are maintained. There is soft tissue gas overlying the mid upper aspect of the arm. Soft tissue edema is seen laterally. IMPRESSION: 1. No fracture or dislocation. 2. Moderate volume patchy soft tissue gas overlying the mid upper arm. In the absence of laceration, this is suspicious for necrotizing soft tissue infection. Overlying soft tissue edema. These results were called by telephone at the time of interpretation on 07/20/2022 at 4:56 pm to provider James Baker , who verbally acknowledged these  results. Electronically Signed   By: James Baker M.D.   On: 07/20/2022 16:57    EKG: Pending.   Assessment/Plan Principal Problem:   Cellulitis of right upper extremity Active Problems:   Asthma   Sepsis ([place])   Hyponatremia    Assessment and Plan: * Cellulitis of right upper extremity Right upper extremity cellulitis with abscess, meeting sepsis criteria.  Recent tattoos to same extremity ~ 3 weeks ago that became infected, and subsequent trauma with a pipe falling on the same extremity 3 days ago.  CT of the right humerus is showing a large peripherally enhancing abscess with large amount of gas, larger component measuring up to 9.2 cm, and smaller component up  to 6.2 cm.  Findings consistent with necrotizing abscess/collection. -Infection likely started from initial tattoo which he gave himself- ~3 weeks ago that became infected. -Denies IV drug use, UDS negative for opiates -ED provider called Ortho, and I also called Orthopedist on-call, Dr. Charlann Baker about CT findings, he recommended broad-spectrum IV antibiotics and patient will be seen in the morning.  Okay to admit to [facility] rather than [facility]. -IV Vanco, ceftriaxone and metronidazole -N.p.o. midnight -IV morphine 2 mg q4hr prn.  Tobacco abuse Smokes 1-1/2 pack of cigarettes daily.  UDS is also positive for amphetamines, benzos, and THC. -Nicotine patch  Hyponatremia Sodium 130.  No baseline to compare.  Denies alcohol use.  Denies GI losses -Check serum and urine osmolality -Urine sodium -Hydrate  Sepsis (HCC) Meeting sepsis criteria with initial tachycardia to 124, leukocytosis of 22.1.  Tmax 99.3.  Lactic acid 2 >> 1.1.  sepsis secondary to right upper extremity necrotizing abscess/cellulitis.  -2.5 L bolus given, continue N/s 125cc/hr x 20hrs -Follow-up blood cultures  Asthma Denies respiratory symptoms.  But he is wheezing, reports wheezing at baseline.  O2 sats greater than 90%.  Ongoing tobacco  use -Albuterol nebulizer scheduled x2, continue as needed    DVT prophylaxis: SCDs Code Status: Full code Family Communication: Spouse at bedside Disposition Plan: > 2 days Consults called: Orthopedics Dr. Charlann Baker Admission status: Inpt tele I certify that at the point of admission it is my clinical judgment that the patient will require inpatient hospital care spanning beyond 2 midnights from the point of admission due to high intensity of service, high risk for further deterioration and high frequency of surveillance required.   Author: Onnie Boer, MD 07/20/2022 9:53 PM  For on call review [email or web address].

## 2022-07-20 NOTE — Sepsis Progress Note (Signed)
Sepsis protocol is being followed by [facility]. 

## 2022-07-20 NOTE — Assessment & Plan Note (Signed)
Transient, resolved with fluids.

## 2022-07-20 NOTE — Progress Notes (Signed)
Pharmacy Antibiotic Note  James Baker is a 46 y.o. male admitted on 07/20/2022 with cellulitis.  Pharmacy has been consulted for Vancomycin dosing.  Plan: Vancomycin 1250mg  IV loading dose then 750 mg IV Q 12 hrs. Goal AUC 400-550. Expected AUC: 480 SCr used: 0.97 Also on ceftriaxone 2gm IV q24  Flagyl 500mg  IV q12h F/U cxs and clinical progress Monitor V/S, Labs and levels as indicated  Height: 5\' 10"  (177.8 cm) Weight: 61.2 kg (135 lb) IBW/kg (Calculated) : 73  Temp (24hrs), Avg:99.3 F (37.4 C), Min:99.3 F (37.4 C), Max:99.3 F (37.4 C)  Recent Labs  Lab 07/20/22 1633  WBC 22.1*  CREATININE 0.97  LATICACIDVEN 2.0*    Estimated Creatinine Clearance: 84.1 mL/min (by C-G formula based on SCr of 0.97 mg/dL).    Allergies  Allergen Reactions   Hydrocodone Itching    Antimicrobials this admission: Vancomycin 8/20 >>  ceftriaxone 820 >>  Metronidazole 8/20>>  Microbiology results: 8/20 BCx: pending  MRSA PCR:   Thank you for allowing pharmacy to be a part of this patient's care.  07/22/22, BS Pharm D, BCPS Clinical Pharmacist 07/20/2022 6:06 PM

## 2022-07-20 NOTE — Assessment & Plan Note (Deleted)
Denies respiratory symptoms.  But he is wheezing, reports wheezing at baseline.  O2 sats greater than 90%.  Ongoing tobacco use -Albuterol nebulizer scheduled x2, continue as needed

## 2022-07-20 NOTE — ED Triage Notes (Signed)
Pt with redness and pain to right upper arm.  Pt with recent tattoo to right arm about 3 weeks ago, also states a pipe fell on right upper arm on Thursday.

## 2022-07-20 NOTE — ED Notes (Signed)
Pt transported to radiology.

## 2022-07-20 NOTE — Assessment & Plan Note (Addendum)
Patient presented with tachycardia to the 120s, leukocytosis, elevated lactic acid, and greater than 40 pt drop in blood pressure from baseline (baseline 130s to 140s, here 90-95 systolic in the ER)  Cultures no growth to date. -Continue vancomycin, Rocephin, and Flagyl - Orthopedics are consulted, and have him posted for the OR today -Please obtain intraoperative culture

## 2022-07-20 NOTE — Assessment & Plan Note (Addendum)
Right upper extremity cellulitis with abscess, meeting sepsis criteria.  Recent tattoos to same extremity ~ 3 weeks ago that became infected, and subsequent trauma with a pipe falling on the same extremity 3 days ago.  CT of the right humerus is showing a large peripherally enhancing abscess with large amount of gas, larger component measuring up to 9.2 cm, and smaller component up to 6.2 cm.  Findings consistent with necrotizing abscess/collection. -Infection likely started from initial tattoo which he gave himself- ~3 weeks ago that became infected. -Denies IV drug use, UDS negative for opiates -ED provider called Ortho, and I also called Orthopedist on-call, Dr. Charlann Boxer about CT findings, he recommended broad-spectrum IV antibiotics and patient will be seen in the morning.  Okay to admit to [facility] rather than [facility]. -IV Vanco, ceftriaxone and metronidazole -N.p.o. midnight -IV morphine 2 mg q4hr prn.

## 2022-07-20 NOTE — Assessment & Plan Note (Signed)
Smokes 1-1/2 pack of cigarettes daily.  UDS is also positive for amphetamines, benzos, and THC. -Nicotine patch

## 2022-07-21 ENCOUNTER — Other Ambulatory Visit: Payer: Self-pay

## 2022-07-21 ENCOUNTER — Encounter (HOSPITAL_COMMUNITY)
Admission: EM | Discharge status: Against Medical Advice | Payer: Self-pay | Source: Home / Self Care | Attending: Internal Medicine

## 2022-07-21 ENCOUNTER — Encounter (HOSPITAL_COMMUNITY): Payer: Self-pay | Admitting: Internal Medicine

## 2022-07-21 ENCOUNTER — Inpatient Hospital Stay (HOSPITAL_COMMUNITY): Payer: Self-pay | Admitting: Certified Registered Nurse Anesthetist

## 2022-07-21 ENCOUNTER — Inpatient Hospital Stay (HOSPITAL_COMMUNITY): Payer: Self-pay | Admitting: Anesthesiology

## 2022-07-21 ENCOUNTER — Inpatient Hospital Stay (HOSPITAL_COMMUNITY): Payer: 59 | Admitting: Certified Registered Nurse Anesthetist

## 2022-07-21 DIAGNOSIS — Z720 Tobacco use: Secondary | ICD-10-CM

## 2022-07-21 DIAGNOSIS — E871 Hypo-osmolality and hyponatremia: Secondary | ICD-10-CM

## 2022-07-21 DIAGNOSIS — L03113 Cellulitis of right upper limb: Secondary | ICD-10-CM

## 2022-07-21 DIAGNOSIS — A419 Sepsis, unspecified organism: Secondary | ICD-10-CM | POA: Diagnosis not present

## 2022-07-21 DIAGNOSIS — R6520 Severe sepsis without septic shock: Secondary | ICD-10-CM

## 2022-07-21 DIAGNOSIS — L0390 Cellulitis, unspecified: Secondary | ICD-10-CM | POA: Diagnosis not present

## 2022-07-21 HISTORY — PX: I & D EXTREMITY: SHX5045

## 2022-07-21 LAB — BASIC METABOLIC PANEL
Anion gap: 6 (ref 5–15)
BUN: 14 mg/dL (ref 6–20)
CO2: 25 mmol/L (ref 22–32)
Calcium: 8.3 mg/dL — ABNORMAL LOW (ref 8.9–10.3)
Chloride: 107 mmol/L (ref 98–111)
Creatinine, Ser: 0.96 mg/dL (ref 0.61–1.24)
GFR, Estimated: >60 mL/min (ref >60)
Glucose, Bld: 111 mg/dL — ABNORMAL HIGH (ref 70–99)
Potassium: 3.6 mmol/L (ref 3.5–5.1)
Sodium: 138 mmol/L (ref 135–145)

## 2022-07-21 LAB — OSMOLALITY: Osmolality: 276 mOsm/kg (ref 275–295)

## 2022-07-21 LAB — OSMOLALITY, URINE: Osmolality, Ur: 410 mOsm/kg (ref 300–900)

## 2022-07-21 LAB — HIV ANTIBODY (ROUTINE TESTING W REFLEX): HIV Screen 4th Generation wRfx: NONREACTIVE

## 2022-07-21 LAB — CBC
HCT: 36.6 % — ABNORMAL LOW (ref 39.0–52.0)
Hemoglobin: 11.8 g/dL — ABNORMAL LOW (ref 13.0–17.0)
MCH: 27 pg (ref 26.0–34.0)
MCHC: 32.2 g/dL (ref 30.0–36.0)
MCV: 83.8 fL (ref 80.0–100.0)
Platelets: 285 10*3/uL (ref 150–400)
RBC: 4.37 MIL/uL (ref 4.22–5.81)
RDW: 15.5 % (ref 11.5–15.5)
WBC: 20.2 10*3/uL — ABNORMAL HIGH (ref 4.0–10.5)
nRBC: 0 % (ref 0.0–0.2)

## 2022-07-21 LAB — SODIUM, URINE, RANDOM: Sodium, Ur: 22 mmol/L

## 2022-07-21 LAB — MRSA NEXT GEN BY PCR, NASAL: MRSA by PCR Next Gen: NOT DETECTED

## 2022-07-21 SURGERY — IRRIGATION AND DEBRIDEMENT EXTREMITY
Anesthesia: General | Site: Arm Upper | Laterality: Right

## 2022-07-21 SURGERY — IRRIGATION AND DEBRIDEMENT EXTREMITY
Anesthesia: Choice | Laterality: Right

## 2022-07-21 MED ORDER — DEXAMETHASONE SODIUM PHOSPHATE 10 MG/ML IJ SOLN
INTRAMUSCULAR | Status: DC | PRN
  Administered 2022-07-21: 10 mg via INTRAVENOUS

## 2022-07-21 MED ORDER — SODIUM CHLORIDE 0.9 % IV SOLN
2.0000 g | Freq: Every day | INTRAVENOUS | Status: DC
  Administered 2022-07-21: 2 g via INTRAVENOUS
  Filled 2022-07-21: qty 20

## 2022-07-21 MED ORDER — LIDOCAINE 2% (20 MG/ML) 5 ML SYRINGE
INTRAMUSCULAR | Status: DC | PRN
  Administered 2022-07-21: 60 mg via INTRAVENOUS

## 2022-07-21 MED ORDER — ACETAMINOPHEN 500 MG PO TABS
1000.0000 mg | ORAL_TABLET | Freq: Once | ORAL | Status: AC
  Administered 2022-07-21: 1000 mg via ORAL

## 2022-07-21 MED ORDER — PROPOFOL 10 MG/ML IV BOLUS
INTRAVENOUS | Status: AC
  Filled 2022-07-21: qty 20

## 2022-07-21 MED ORDER — MORPHINE SULFATE (PF) 2 MG/ML IV SOLN
2.0000 mg | INTRAVENOUS | Status: DC | PRN
  Administered 2022-07-22 – 2022-07-24 (×6): 2 mg via INTRAVENOUS
  Filled 2022-07-21 (×6): qty 1

## 2022-07-21 MED ORDER — SODIUM CHLORIDE 0.9 % IR SOLN
Status: DC | PRN
  Administered 2022-07-21: 6000 mL

## 2022-07-21 MED ORDER — VANCOMYCIN HCL 750 MG/150ML IV SOLN
750.0000 mg | Freq: Two times a day (BID) | INTRAVENOUS | Status: DC
  Administered 2022-07-21 – 2022-07-22 (×2): 750 mg via INTRAVENOUS
  Filled 2022-07-21 (×2): qty 150

## 2022-07-21 MED ORDER — MIDAZOLAM HCL 2 MG/2ML IJ SOLN
INTRAMUSCULAR | Status: AC
  Filled 2022-07-21: qty 2

## 2022-07-21 MED ORDER — OXYCODONE HCL 5 MG/5ML PO SOLN: 5.0000 mg | Freq: Once | ORAL | Status: AC | PRN

## 2022-07-21 MED ORDER — FENTANYL CITRATE (PF) 100 MCG/2ML IJ SOLN
25.0000 ug | INTRAMUSCULAR | Status: DC | PRN
  Administered 2022-07-21 (×2): 50 ug via INTRAVENOUS

## 2022-07-21 MED ORDER — MIDAZOLAM HCL 2 MG/2ML IJ SOLN
INTRAMUSCULAR | Status: DC | PRN
  Administered 2022-07-21: 2 mg via INTRAVENOUS

## 2022-07-21 MED ORDER — ONDANSETRON HCL 4 MG/2ML IJ SOLN: 4.0000 mg | Freq: Four times a day (QID) | INTRAMUSCULAR | Status: DC | PRN

## 2022-07-21 MED ORDER — ONDANSETRON HCL 4 MG PO TABS: 4.0000 mg | ORAL_TABLET | Freq: Four times a day (QID) | ORAL | Status: DC | PRN

## 2022-07-21 MED ORDER — PROMETHAZINE HCL 25 MG/ML IJ SOLN: 6.2500 mg | INTRAMUSCULAR | Status: DC | PRN

## 2022-07-21 MED ORDER — ACETAMINOPHEN 325 MG PO TABS
650.0000 mg | ORAL_TABLET | Freq: Four times a day (QID) | ORAL | Status: DC | PRN
  Administered 2022-07-21 (×2): 650 mg via ORAL
  Filled 2022-07-21 (×2): qty 2

## 2022-07-21 MED ORDER — ONDANSETRON HCL 4 MG/2ML IJ SOLN
INTRAMUSCULAR | Status: AC
  Filled 2022-07-21: qty 2

## 2022-07-21 MED ORDER — OXYCODONE HCL 5 MG PO TABS
5.0000 mg | ORAL_TABLET | Freq: Once | ORAL | Status: AC | PRN
  Administered 2022-07-21: 5 mg via ORAL

## 2022-07-21 MED ORDER — MORPHINE SULFATE (PF) 2 MG/ML IV SOLN
2.0000 mg | Freq: Once | INTRAVENOUS | Status: AC
  Administered 2022-07-21: 2 mg via INTRAVENOUS
  Filled 2022-07-21: qty 1

## 2022-07-21 MED ORDER — ACETAMINOPHEN 650 MG RE SUPP: 650.0000 mg | Freq: Four times a day (QID) | RECTAL | Status: DC | PRN

## 2022-07-21 MED ORDER — PHENYLEPHRINE 80 MCG/ML (10ML) SYRINGE FOR IV PUSH (FOR BLOOD PRESSURE SUPPORT)
PREFILLED_SYRINGE | INTRAVENOUS | Status: DC | PRN
  Administered 2022-07-21 (×2): 160 ug via INTRAVENOUS
  Administered 2022-07-21: 80 ug via INTRAVENOUS
  Administered 2022-07-21: 160 ug via INTRAVENOUS

## 2022-07-21 MED ORDER — ORAL CARE MOUTH RINSE: 15.0000 mL | Freq: Once | OROMUCOSAL | Status: AC

## 2022-07-21 MED ORDER — MUPIROCIN 2 % EX OINT: 1.0000 | TOPICAL_OINTMENT | Freq: Two times a day (BID) | CUTANEOUS | Status: DC

## 2022-07-21 MED ORDER — OXYCODONE HCL 5 MG PO TABS
5.0000 mg | ORAL_TABLET | ORAL | Status: DC | PRN
  Administered 2022-07-21 – 2022-08-01 (×11): 5 mg via ORAL
  Filled 2022-07-21 (×12): qty 1

## 2022-07-21 MED ORDER — CELECOXIB 200 MG PO CAPS
200.0000 mg | ORAL_CAPSULE | Freq: Once | ORAL | Status: AC
Start: 2022-07-21 — End: 2022-07-21
  Administered 2022-07-21: 200 mg via ORAL

## 2022-07-21 MED ORDER — POLYETHYLENE GLYCOL 3350 17 G PO PACK: 17.0000 g | PACK | Freq: Every day | ORAL | Status: DC | PRN

## 2022-07-21 MED ORDER — ONDANSETRON HCL 4 MG/2ML IJ SOLN
INTRAMUSCULAR | Status: DC | PRN
  Administered 2022-07-21: 4 mg via INTRAVENOUS

## 2022-07-21 MED ORDER — 0.9 % SODIUM CHLORIDE (POUR BTL) OPTIME
TOPICAL | Status: DC | PRN
  Administered 2022-07-21: 1000 mL

## 2022-07-21 MED ORDER — LACTATED RINGERS IV SOLN
INTRAVENOUS | Status: DC
Start: 2022-07-21 — End: 2022-07-21

## 2022-07-21 MED ORDER — FENTANYL CITRATE (PF) 250 MCG/5ML IJ SOLN
INTRAMUSCULAR | Status: DC | PRN
  Administered 2022-07-21 (×2): 50 ug via INTRAVENOUS
  Administered 2022-07-21: 100 ug via INTRAVENOUS
  Administered 2022-07-21: 50 ug via INTRAVENOUS

## 2022-07-21 MED ORDER — CHLORHEXIDINE GLUCONATE 0.12 % MT SOLN
15.0000 mL | Freq: Once | OROMUCOSAL | Status: AC
  Administered 2022-07-21: 15 mL via OROMUCOSAL

## 2022-07-21 MED ORDER — PROPOFOL 10 MG/ML IV BOLUS
INTRAVENOUS | Status: DC | PRN
  Administered 2022-07-21: 160 mg via INTRAVENOUS

## 2022-07-21 MED ORDER — ACETAMINOPHEN 500 MG PO TABS
1000.0000 mg | ORAL_TABLET | Freq: Three times a day (TID) | ORAL | Status: DC
  Administered 2022-07-21 – 2022-07-31 (×26): 1000 mg via ORAL
  Filled 2022-07-21 (×30): qty 2

## 2022-07-21 MED ORDER — SUGAMMADEX SODIUM 200 MG/2ML IV SOLN
INTRAVENOUS | Status: DC | PRN
  Administered 2022-07-21: 200 mg via INTRAVENOUS

## 2022-07-21 MED ORDER — ROCURONIUM BROMIDE 10 MG/ML (PF) SYRINGE
PREFILLED_SYRINGE | INTRAVENOUS | Status: DC | PRN
  Administered 2022-07-21: 15 mg via INTRAVENOUS
  Administered 2022-07-21: 50 mg via INTRAVENOUS

## 2022-07-21 MED ORDER — FENTANYL CITRATE (PF) 100 MCG/2ML IJ SOLN
25.0000 ug | INTRAMUSCULAR | Status: DC | PRN
  Administered 2022-07-21 (×3): 50 ug via INTRAVENOUS

## 2022-07-21 SURGICAL SUPPLY — 68 items
BAG COUNTER SPONGE SURGICOUNT (BAG) ×1 IMPLANT
BAG SPNG CNTER NS LX DISP (BAG) ×1
BENZOIN TINCTURE PRP APPL 2/3 (GAUZE/BANDAGES/DRESSINGS) IMPLANT
BLADE SURG 10 STRL SS (BLADE) IMPLANT
BNDG ELASTIC 2X5.8 VLCR STR LF (GAUZE/BANDAGES/DRESSINGS) ×1 IMPLANT
BNDG ELASTIC 3X5.8 VLCR STR LF (GAUZE/BANDAGES/DRESSINGS) ×1 IMPLANT
BNDG ELASTIC 4X5.8 VLCR STR LF (GAUZE/BANDAGES/DRESSINGS) ×1 IMPLANT
BNDG ESMARK 4X9 LF (GAUZE/BANDAGES/DRESSINGS) IMPLANT
CANISTER WOUND CARE 500ML ATS (WOUND CARE) IMPLANT
CHLORAPREP W/TINT 26 (MISCELLANEOUS) ×1 IMPLANT
CORD BIPOLAR FORCEPS 12FT (ELECTRODE) ×1 IMPLANT
COVER BACK TABLE 60X90IN (DRAPES) IMPLANT
COVER MAYO STAND STRL (DRAPES) ×1 IMPLANT
COVER SURGICAL LIGHT HANDLE (MISCELLANEOUS) ×1 IMPLANT
CUFF TOURN SGL QUICK 18X4 (TOURNIQUET CUFF) ×1 IMPLANT
CUFF TOURN SGL QUICK 24 (TOURNIQUET CUFF)
CUFF TRNQT CYL 24X4X16.5-23 (TOURNIQUET CUFF) IMPLANT
DRAIN PENROSE 18X1/4 LTX STRL (DRAIN) IMPLANT
DRAPE HALF SHEET 40X57 (DRAPES) ×1 IMPLANT
DRAPE IMP U-DRAPE 54X76 (DRAPES) IMPLANT
DRAPE OEC MINIVIEW 54X84 (DRAPES) IMPLANT
DRAPE SURG 17X23 STRL (DRAPES) ×1 IMPLANT
DRSG ADAPTIC 3X8 NADH LF (GAUZE/BANDAGES/DRESSINGS) ×1 IMPLANT
GAUZE SPONGE 4X4 12PLY STRL (GAUZE/BANDAGES/DRESSINGS) ×1 IMPLANT
GAUZE XEROFORM 1X8 LF (GAUZE/BANDAGES/DRESSINGS) ×1 IMPLANT
GLOVE BIO SURGEON STRL SZ7 (GLOVE) ×1 IMPLANT
GLOVE BIOGEL PI IND STRL 7.5 (GLOVE) IMPLANT
GLOVE BIOGEL PI INDICATOR 7.5 (GLOVE) ×1
GLOVE SURG SS PI 7.0 STRL IVOR (GLOVE) IMPLANT
GLOVE SURG UNDER POLY LF SZ7 (GLOVE) ×1 IMPLANT
GOWN STRL REUS W/ TWL XL LVL3 (GOWN DISPOSABLE) ×2 IMPLANT
GOWN STRL REUS W/TWL XL LVL3 (GOWN DISPOSABLE) ×2
IV CATH 18G X1.75 CATHLON (IV SOLUTION) IMPLANT
KIT BASIN OR (CUSTOM PROCEDURE TRAY) ×1 IMPLANT
KIT TURNOVER KIT B (KITS) ×1 IMPLANT
LOOP VESSEL MAXI BLUE (MISCELLANEOUS) IMPLANT
LOOP VESSEL MINI RED (MISCELLANEOUS) IMPLANT
MANIFOLD NEPTUNE II (INSTRUMENTS) IMPLANT
NDL HYPO 25GX1X1/2 BEV (NEEDLE) IMPLANT
NDL HYPO 25X1 1.5 SAFETY (NEEDLE) ×1 IMPLANT
NDL KEITH (NEEDLE) IMPLANT
NEEDLE HYPO 25GX1X1/2 BEV (NEEDLE) IMPLANT
NEEDLE HYPO 25X1 1.5 SAFETY (NEEDLE) ×1 IMPLANT
NEEDLE KEITH (NEEDLE) IMPLANT
NS IRRIG 1000ML POUR BTL (IV SOLUTION) ×1 IMPLANT
PACK ORTHO EXTREMITY (CUSTOM PROCEDURE TRAY) ×1 IMPLANT
PAD ABD 8X10 STRL (GAUZE/BANDAGES/DRESSINGS) ×1 IMPLANT
PAD ARMBOARD 7.5X6 YLW CONV (MISCELLANEOUS) ×1 IMPLANT
PAD CAST 4YDX4 CTTN HI CHSV (CAST SUPPLIES) ×2 IMPLANT
PADDING CAST ABS 3INX4YD NS (CAST SUPPLIES) ×1
PADDING CAST ABS COTTON 3X4 (CAST SUPPLIES) ×1 IMPLANT
PADDING CAST COTTON 4X4 STRL (CAST SUPPLIES) ×2
SET IRRIG Y TYPE TUR BLADDER L (SET/KITS/TRAYS/PACK) IMPLANT
SPLINT PLASTER CAST XFAST 3X15 (CAST SUPPLIES) ×1 IMPLANT
SPLINT PLASTER XTRA FASTSET 3X (CAST SUPPLIES) ×1
SPONGE T-LAP 4X18 ~~LOC~~+RFID (SPONGE) ×1 IMPLANT
STAPLER VISISTAT 35W (STAPLE) IMPLANT
SUT FIBERWIRE 3-0 18 TAPR NDL (SUTURE)
SUTURE FIBERWR 3-0 18 TAPR NDL (SUTURE) IMPLANT
SWAB CULTURE ESWAB REG 1ML (MISCELLANEOUS) IMPLANT
SYR BULB EAR ULCER 3OZ GRN STR (SYRINGE) ×1 IMPLANT
SYR CONTROL 10ML LL (SYRINGE) IMPLANT
TOWEL GREEN STERILE (TOWEL DISPOSABLE) ×1 IMPLANT
TOWEL GREEN STERILE FF (TOWEL DISPOSABLE) ×2 IMPLANT
TUBE CONNECTING 12X1/4 (SUCTIONS) IMPLANT
UNDERPAD 30X36 HEAVY ABSORB (UNDERPADS AND DIAPERS) ×1 IMPLANT
WATER STERILE IRR 1000ML POUR (IV SOLUTION) ×1 IMPLANT
YANKAUER SUCT BULB TIP NO VENT (SUCTIONS) IMPLANT

## 2022-07-21 SURGICAL SUPPLY — 68 items
BAG COUNTER SPONGE SURGICOUNT (BAG) ×1 IMPLANT
BLADE SURG 15 STRL LF DISP TIS (BLADE) ×2 IMPLANT
BLADE SURG 15 STRL SS (BLADE) ×2
BNDG CMPR 75X21 PLY HI ABS (MISCELLANEOUS)
BNDG ELASTIC 2X5.8 VLCR STR LF (GAUZE/BANDAGES/DRESSINGS) ×1 IMPLANT
BNDG ELASTIC 3X5.8 VLCR STR LF (GAUZE/BANDAGES/DRESSINGS) ×1 IMPLANT
BNDG ELASTIC 4X5.8 VLCR STR LF (GAUZE/BANDAGES/DRESSINGS) ×1 IMPLANT
BNDG ESMARK 4X9 LF (GAUZE/BANDAGES/DRESSINGS) ×1 IMPLANT
BNDG GAUZE DERMACEA FLUFF (GAUZE/BANDAGES/DRESSINGS) ×3
BNDG GAUZE DERMACEA FLUFF 4 (GAUZE/BANDAGES/DRESSINGS) ×3 IMPLANT
BNDG PLASTER X FAST 3X3 WHT LF (CAST SUPPLIES) ×1 IMPLANT
CATH ROBINSON RED A/P 10FR (CATHETERS) IMPLANT
CHLORAPREP W/TINT 26 (MISCELLANEOUS) ×1 IMPLANT
CORD BIPOLAR FORCEPS 12FT (ELECTRODE) ×1 IMPLANT
COVER BACK TABLE 60X90IN (DRAPES) ×1 IMPLANT
COVER MAYO STAND STRL (DRAPES) ×1 IMPLANT
COVER SURGICAL LIGHT HANDLE (MISCELLANEOUS) ×1 IMPLANT
CUFF TOURN SGL QUICK 18X4 (TOURNIQUET CUFF) ×1 IMPLANT
CUFF TOURN SGL QUICK 24 (TOURNIQUET CUFF)
CUFF TRNQT CYL 24X4X16.5-23 (TOURNIQUET CUFF) IMPLANT
DRAIN PENROSE 0.25X18 (DRAIN) ×1 IMPLANT
DRAPE EXTREMITY T 121X128X90 (DISPOSABLE) ×1 IMPLANT
DRAPE OEC MINIVIEW 54X84 (DRAPES) IMPLANT
DRAPE SHEET LG 3/4 BI-LAMINATE (DRAPES) ×1 IMPLANT
DRAPE SURG 17X23 STRL (DRAPES) ×1 IMPLANT
DRSG ADAPTIC 3X8 NADH LF (GAUZE/BANDAGES/DRESSINGS) ×1 IMPLANT
DRSG PAD ABDOMINAL 8X10 ST (GAUZE/BANDAGES/DRESSINGS) ×1 IMPLANT
GAUZE SPONGE 4X4 12PLY STRL (GAUZE/BANDAGES/DRESSINGS) ×1 IMPLANT
GAUZE STRETCH 2X75IN STRL (MISCELLANEOUS) IMPLANT
GAUZE XEROFORM 1X8 LF (GAUZE/BANDAGES/DRESSINGS) ×1 IMPLANT
GLOVE BIOGEL M 7.0 STRL (GLOVE) ×1 IMPLANT
GLOVE BIOGEL M 8.0 STRL (GLOVE) ×1 IMPLANT
GLOVE BIOGEL PI IND STRL 7.0 (GLOVE) ×1 IMPLANT
GLOVE BIOGEL PI INDICATOR 7.0 (GLOVE) ×1
GLOVE SS BIOGEL STRL SZ 8 (GLOVE) ×1 IMPLANT
GLOVE SUPERSENSE BIOGEL SZ 8 (GLOVE) ×1
GOWN STRL REUS W/ TWL LRG LVL3 (GOWN DISPOSABLE) ×2 IMPLANT
GOWN STRL REUS W/ TWL XL LVL3 (GOWN DISPOSABLE) ×2 IMPLANT
GOWN STRL REUS W/TWL LRG LVL3 (GOWN DISPOSABLE) ×2
GOWN STRL REUS W/TWL XL LVL3 (GOWN DISPOSABLE) ×2
KIT BASIN OR (CUSTOM PROCEDURE TRAY) ×1 IMPLANT
KIT TURNOVER KIT A (KITS) ×1 IMPLANT
LOOP VESSEL MAXI BLUE (MISCELLANEOUS) ×1 IMPLANT
MANIFOLD NEPTUNE II (INSTRUMENTS) ×1 IMPLANT
NEEDLE HYPO 25X1 1.5 SAFETY (NEEDLE) ×1 IMPLANT
NEEDLE KEITH (NEEDLE) IMPLANT
NS IRRIG 1000ML POUR BTL (IV SOLUTION) ×1 IMPLANT
PACK ORTHO EXTREMITY (CUSTOM PROCEDURE TRAY) ×1 IMPLANT
PAD ARMBOARD 7.5X6 YLW CONV (MISCELLANEOUS) ×1 IMPLANT
PAD CAST 4YDX4 CTTN HI CHSV (CAST SUPPLIES) ×2 IMPLANT
PADDING CAST ABS 3INX4YD NS (CAST SUPPLIES) ×1
PADDING CAST ABS COTTON 3X4 (CAST SUPPLIES) ×1 IMPLANT
PADDING CAST COTTON 4X4 STRL (CAST SUPPLIES) ×2
SET IRRIG Y TYPE TUR BLADDER L (SET/KITS/TRAYS/PACK) ×1 IMPLANT
SOL PREP POV-IOD 4OZ 10% (MISCELLANEOUS) ×2 IMPLANT
SPIKE FLUID TRANSFER (MISCELLANEOUS) ×1 IMPLANT
SPONGE T-LAP 4X18 ~~LOC~~+RFID (SPONGE) ×1 IMPLANT
SUT ETHILON 4 0 PS 2 18 (SUTURE) ×3 IMPLANT
SUT FIBERWIRE 3-0 18 TAPR NDL (SUTURE) ×1
SUTURE FIBERWR 3-0 18 TAPR NDL (SUTURE) ×1 IMPLANT
SWAB CULTURE ESWAB REG 1ML (MISCELLANEOUS) IMPLANT
SYR BULB EAR ULCER 3OZ GRN STR (SYRINGE) ×1 IMPLANT
SYR CONTROL 10ML LL (SYRINGE) IMPLANT
TOWEL OR NON WOVEN STRL DISP B (DISPOSABLE) ×2 IMPLANT
TUBING CONNECTING 10 (TUBING) ×1 IMPLANT
UNDERPAD 30X36 HEAVY ABSORB (UNDERPADS AND DIAPERS) ×1 IMPLANT
WATER STERILE IRR 1000ML POUR (IV SOLUTION) ×1 IMPLANT
YANKAUER SUCT BULB TIP NO VENT (SUCTIONS) ×1 IMPLANT

## 2022-07-21 NOTE — Progress Notes (Signed)
Patient transferred via EMS to [facility] short stay. Tylenol 650 mg given PRN with sip and then one time dose of Morphine 2 mg IV at time of transfer.

## 2022-07-21 NOTE — H&P (View-Only) (Signed)
HAND SURGERY CONSULTATION  REQUESTING PHYSICIAN: Alberteen Sam, *   Chief Complaint: Right arm pain  HPI: James Baker is a 46 y.o. male who presents with severe pain in his right upper arm with CT findings consistent with necrotizing abscess.  Patient tattoo'd himself in the right AC fossa a few weeks ago with what sounds like a local infection that resolved.  He was struck on his upper arm with a metal pipe on Thursday with progressing pain, swelling, and erythema.  His pain started around Saturday evening/Sunday morning. He is very uncomfortable today which started overnight.  His pain is localized to the deltoid, upper arm.  He has some pain over the anterior chest which is new.  He denies pain distal to the elbow.  He denies numbness or paresthesias.  He does not feel systemically ill.  Patient initially presented to [facility] yesterday afternoon and was transferred to [facility].  I was asked to evaluate patient his morning.     Past Medical History:  Diagnosis Date   Asthma    History reviewed. No pertinent surgical history. Social History   Socioeconomic History   Marital status: Single    Spouse name: Not on file   Number of children: Not on file   Years of education: Not on file   Highest education level: Not on file  Occupational History   Not on file  Tobacco Use   Smoking status: Every Day    Packs/day: 1.00    Years: 15.00    Total pack years: 15.00    Types: Cigarettes   Smokeless tobacco: Never  Vaping Use   Vaping Use: Never used  Substance and Sexual Activity   Alcohol use: Yes    Alcohol/week: 3.0 standard drinks of alcohol    Types: 3 Cans of beer per week    Comment: occasionally   Drug use: No   Sexual activity: Never  Other Topics Concern   Not on file  Social History Narrative   Not on file   Social Determinants of Health   Financial Resource Strain: Not on file  Food Insecurity: Not on file  Transportation Needs: Not on  file  Physical Activity: Not on file  Stress: Not on file  Social Connections: Not on file   Family History  Problem Relation Age of Onset   Cancer Mother    Stroke Father    Seizures Father    Cancer Other    - negative except otherwise stated in the family history section Allergies  Allergen Reactions   Hydrocodone Itching   Prior to Admission medications   Medication Sig Start Date End Date Taking? Authorizing Provider  acetaminophen (TYLENOL) 500 MG tablet Take 1,000 mg by mouth every 6 (six) hours as needed for moderate pain.   Yes [provider]   CT HUMERUS RIGHT W CONTRAST  Result Date: 07/20/2022 CLINICAL DATA:  Right upper extremity cellulitis. EXAM: CT OF THE UPPER RIGHT EXTREMITY WITH CONTRAST TECHNIQUE: Multidetector CT imaging of the upper right extremity was performed according to the standard protocol following intravenous contrast administration. RADIATION DOSE REDUCTION: This exam was performed according to the departmental dose-optimization program which includes automated exposure control, adjustment of the mA and/or kV according to patient size and/or use of iterative reconstruction technique. CONTRAST:  59mL OMNIPAQUE IOHEXOL 300 MG/ML  SOLN COMPARISON:  Radiograph performed earlier on the same date. FINDINGS: Bones/Joint/Cartilage No evidence of fracture or dislocation. No cortical erosion or periosteal reaction.  Ligaments Suboptimally assessed by CT. Muscles and Tendons There is a large elongated peripherally enhancing low-density collection with gas in the subcutaneous soft tissues of the anterolateral arm. This process involves the anteroinferior deltoid and extends into the lateral aspect of the biceps muscle. In the deltoid this process measures at least 2.4 x 5.7 x 9.2 cm and superiorly in the anterior fibers of the deltoid it measures at least 2.4 x 1.9 x 6.2 cm. Soft tissues Subcutaneous soft tissue edema and skin thickening suggesting cellulitis.  IMPRESSION: 1. Large peripherally enhancing abscess with large amount of gas on the anterolateral aspect of the arm involving the deltoid and biceps muscles. The larger component in the biceps measures at least 2.4 x 5.7 x 9.2 cm and smaller component in the deltoid measures at least 2.4 x 1.9 x 6.2 cm. Findings most consistent with necrotizing abscess/collection. Surgical consultation for further management is recommended. 2.  Skin thickening and subcutaneous soft tissue edema. 3.  No acute osseous abnormality. Electronically Signed   By: Larose Hires D.O.   On: 07/20/2022 19:58   DG Humerus Right  Result Date: 07/20/2022 CLINICAL DATA:  Trauma. Right arm pain. Pipe fell on arm at work on Thursday, self tattoo few weeks ago. EXAM: RIGHT HUMERUS - 2+ VIEW COMPARISON:  None Available. FINDINGS: Cortical margins of the humerus are intact. There is no evidence of fracture or other focal bone lesions. Elbow and shoulder alignment are maintained. There is soft tissue gas overlying the mid upper aspect of the arm. Soft tissue edema is seen laterally. IMPRESSION: 1. No fracture or dislocation. 2. Moderate volume patchy soft tissue gas overlying the mid upper arm. In the absence of laceration, this is suspicious for necrotizing soft tissue infection. Overlying soft tissue edema. These results were called by telephone at the time of interpretation on 07/20/2022 at 4:56 pm to provider [facility] ZAMMIT , who verbally acknowledged these results. Electronically Signed   By: Narda Rutherford M.D.   On: 07/20/2022 16:57   - Positive ROS: All other systems have been reviewed and were otherwise negative with the exception of those mentioned in the HPI and as above.  Physical Exam: General: No acute distress, resting comfortably Cardiovascular: BUE warm and well perfused, normal rate Respiratory: Normal WOB on RA Skin: Warm and dry Neurologic: Sensation intact distally Psychiatric: Patient is at baseline mood and  affect  Right Upper Extremity  Significant swelling from [facility] fossa to anterior   Deltoid Erythema over anterior arm from [facility] fossa to anterior deltoid with mild erythema spreading over pec area Palpable fluctuance over bicep Full ROM of wrist and hand SILT throughout Hand warm and well perfused  Assessment: 46 yo M w/ right upper extremity infection with some concern for severe abscess with possible necrotizing infection.   Plan: Discussed with patient and his family that he will need emergent surgical debridement Will transfer from [facility] straight to the [facility] OR given significantly better resources and high level of care Additional plan to follow after OR  Thank you for the consult and the opportunity to see James Baker, M.D. [facility] 7:51 AM

## 2022-07-21 NOTE — Anesthesia Procedure Notes (Signed)
Procedure Name: Intubation Date/Time: 07/21/2022 10:03 AM  Performed by: Inda Coke, CRNAPre-anesthesia Checklist: Patient identified, Emergency Drugs available, Suction available, Timeout performed and Patient being monitored Patient Re-evaluated:Patient Re-evaluated prior to induction Oxygen Delivery Method: Circle system utilized Preoxygenation: Pre-oxygenation with 100% oxygen Induction Type: IV induction Ventilation: Mask ventilation without difficulty Laryngoscope Size: Mac and 4 Grade View: Grade I Tube type: Oral Tube size: 7.5 mm Number of attempts: 1 Airway Equipment and Method: Stylet Placement Confirmation: ETT inserted through vocal cords under direct vision, positive ETCO2, CO2 detector and breath sounds checked- equal and bilateral Secured at: 23 cm Tube secured with: Tape Dental Injury: Teeth and Oropharynx as per pre-operative assessment

## 2022-07-21 NOTE — Anesthesia Postprocedure Evaluation (Signed)
Anesthesia Post Note  Patient: James Baker  Procedure(s) Performed: IRRIGATION AND DEBRIDEMENT EXTREMITY (Right: Arm Upper)     Patient location during evaluation: PACU Anesthesia Type: General Level of consciousness: awake and alert Pain management: pain level controlled Vital Signs Assessment: post-procedure vital signs reviewed and stable Respiratory status: spontaneous breathing, nonlabored ventilation, respiratory function stable and patient connected to nasal cannula oxygen Cardiovascular status: blood pressure returned to baseline and stable Postop Assessment: no apparent nausea or vomiting Anesthetic complications: no   No notable events documented.  Last Vitals:  Vitals:   07/21/22 1245 07/21/22 1300  BP: 113/77 125/84  Pulse: 96 97  Resp: 17 17  Temp:  36.7 C  SpO2: 97% 95%    Last Pain:  Vitals:   07/21/22 1300  TempSrc:   PainSc: 4    Pain Goal: Patients Stated Pain Goal: 0 (07/21/22 0902)                 Dennies Coate

## 2022-07-21 NOTE — Hospital Course (Addendum)
Mr. Rosenwald is a 46 y.o. M with hx smoking who presented with pain and swelling of the right bicep/axilla.  3 weeks PTA, gave self tattoo.  3 days PTA, pipe fell on right arm, subsequently developed, swelling, pain, redness, which progressed.   In the ER, HR 120s, BP 90s, lactate 3, WBC 22K.  X-ray of right arm suspicious for necrotizing infection.  Case discussed immediately with orthopedics who recommended medical therapy and transfer for consultation.

## 2022-07-21 NOTE — Progress Notes (Signed)
  Progress Note   Patient: James Baker UVO:[id number] DOB: 07-02-1976 DOA: 07/20/2022     1 DOS: the patient was seen and examined on 07/21/2022 at 8:40 AM      Brief hospital course: Mr. Moure is a 46 y.o. M with hx smoking who presented with pain and swelling of the right bicep/axilla.  3 weeks PTA, gave self tattoo.  3 days PTA, pipe fell on right arm, subsequently developed, swelling, pain, redness, which progressed.   In the ER, HR 120s, BP 90s, lactate 3, WBC 22K.  X-ray of right arm suspicious for necrotizing infection.  Case discussed immediately with orthopedics who recommended medical therapy and transfer for consultation.     Assessment and Plan: * Severe sepsis [facility]) Patient presented with tachycardia to the 120s, leukocytosis, elevated lactic acid, and greater than 40 pt drop in blood pressure from baseline (baseline 130s to 140s, here 90-95 systolic in the ER)  Cultures no growth to date. -Continue vancomycin, Rocephin, and Flagyl - Orthopedics are consulted, and have him posted for the OR today -Please obtain intraoperative culture    Necrotizing cellulitis of right arm Imaging suggests necrotizing infection in the right arm.    - See above, to the OR today  Tobacco abuse Polysubstance abuse Smokes 1-1/2 pack of cigarettes daily.  UDS is also positive for amphetamines, benzos, and THC.  Hyponatremia Transient, resolved with fluids.          Subjective: Patient is in severe pain, redness and swelling have not changed.  No confusion, chest pain, dyspnea.     Physical Exam: Vitals:   07/21/22 0421 07/21/22 0518 07/21/22 0628 07/21/22 0932  BP: (!) 98/56 126/78 115/78 127/81  Pulse: (!) 102 96 98 (!) 108  Resp: 17 20 17 20   Temp: 98.9 F (37.2 C) 98.3 F (36.8 C) 99.1 F (37.3 C) 98.9 F (37.2 C)  TempSrc:  Oral  Oral  SpO2: 96% 99% 95% 96%  Weight:    61.2 kg  Height:    5\' 10"  (1.778 m)   Thin adult male, lying in bed, appears  uncomfortable. Heart rate somewhat elevated, respiratory rate normal, lungs clear without rales or wheezes Abdomen soft without tenderness palpation or guarding, no ascites or distention Attention normal, affect normal, judgment and insight appear normal  Data Reviewed: Communicated with Dr. COVID-negative Lactate elevated and resolved with fluids Creatinine, potassium, LFTs normal White blood cell count still 20,000 Hemoglobin and platelets normal UDS positive for amphetamines and benzodiazepines CT shows a large peripheral enhancing soft tissue fluid collection with gas  Family Communication: Partner at the bedside    Disposition: Status is: Inpatient The patient was admitted with sepsis from right arm infection, this is some necrotizing component so he is on vancomycin, Rocephin, and Flagyl, and will need urgent debridement which is planned for this morning.        Author: , MD 07/21/2022 9:35 AM  For on call review [email or web address].

## 2022-07-21 NOTE — Brief Op Note (Signed)
07/21/2022  11:35 AM  PATIENT:  James Baker  46 y.o. male  PRE-OPERATIVE DIAGNOSIS:  Cellultis of Right Arm  POST-OPERATIVE DIAGNOSIS:  Cellultis of Right Arm  PROCEDURE:  Procedure(s): IRRIGATION AND DEBRIDEMENT EXTREMITY (Right)  SURGEON:  Surgeon(s) and Role:    * Marlyne Beards, MD - Primary    * Huel Cote, MD - Assisting  PHYSICIAN ASSISTANT:   ASSISTANTS: none   ANESTHESIA:   general  EBL:  100 mL   BLOOD ADMINISTERED:none  DRAINS:  Wound vac    LOCAL MEDICATIONS USED:  NONE  SPECIMEN:  No Specimen  DISPOSITION OF SPECIMEN:  N/A  COUNTS:  YES  TOURNIQUET:  * No tourniquets in log *  DICTATION: .Dragon Dictation  PLAN OF CARE: Admit to inpatient   PATIENT DISPOSITION:  PACU - hemodynamically stable.   Delay start of Pharmacological VTE agent (>24hrs) due to surgical blood loss or risk of bleeding: not applicable  Intraoperative cultures taken, will follow results Continue broad spectrum IV abx Will plan for repeat I&D on Wednesday with possible culture pending appearance of wound

## 2022-07-21 NOTE — Anesthesia Preprocedure Evaluation (Addendum)
Anesthesia Evaluation  Patient identified by MRN, date of birth, ID band Patient awake    Reviewed: Allergy & Precautions, NPO status , Patient's Chart, lab work & pertinent test results  History of Anesthesia Complications Negative for: history of anesthetic complications  Airway Mallampati: II  TM Distance: >3 FB Neck ROM: Full    Dental  (+) Dental Advisory Given, Loose,    Pulmonary asthma , Current Smoker and Patient abstained from smoking.,    Pulmonary exam normal        Cardiovascular negative cardio ROS   Rhythm:Regular Rate:Tachycardia     Neuro/Psych negative neurological ROS  negative psych ROS   GI/Hepatic negative GI ROS, (+)     substance abuse  marijuana use,   Endo/Other  negative endocrine ROS  Renal/GU negative Renal ROS     Musculoskeletal negative musculoskeletal ROS (+)   Abdominal   Peds  Hematology  (+) Blood dyscrasia, anemia ,   Anesthesia Other Findings   Reproductive/Obstetrics                            Anesthesia Physical Anesthesia Plan  ASA: 2 and emergent  Anesthesia Plan: General   Post-op Pain Management: Tylenol PO (pre-op)* and Celebrex PO (pre-op)*   Induction: Intravenous  PONV Risk Score and Plan: 1 and Treatment may vary due to age or medical condition, Ondansetron, Dexamethasone and Midazolam  Airway Management Planned: Oral ETT  Additional Equipment: None  Intra-op Plan:   Post-operative Plan: Extubation in OR  Informed Consent: I have reviewed the patients History and Physical, chart, labs and discussed the procedure including the risks, benefits and alternatives for the proposed anesthesia with the patient or authorized representative who has indicated his/her understanding and acceptance.     Dental advisory given  Plan Discussed with: CRNA and Anesthesiologist  Anesthesia Plan Comments:        Anesthesia Quick  Evaluation

## 2022-07-21 NOTE — Transfer of Care (Signed)
Immediate Anesthesia Transfer of Care Note  Patient: James Baker  Procedure(s) Performed: IRRIGATION AND DEBRIDEMENT EXTREMITY (Right: Arm Upper)  Patient Location: PACU  Anesthesia Type:General  Level of Consciousness: awake, alert  and oriented  Airway & Oxygen Therapy: Patient Spontanous Breathing and Patient connected to nasal cannula oxygen  Post-op Assessment: Report given to RN and Post -op Vital signs reviewed and stable  Post vital signs: Reviewed and stable  Last Vitals:  Vitals Value Taken Time  BP 127/86 07/21/22 1145  Temp    Pulse 109 07/21/22 1146  Resp 13 07/21/22 1146  SpO2 92 % 07/21/22 1146  Vitals shown include unvalidated device data.  Last Pain:  Vitals:   07/21/22 0948  TempSrc:   PainSc: 7       Patients Stated Pain Goal: 0 (07/21/22 0902)  Complications: No notable events documented.

## 2022-07-21 NOTE — Consult Note (Signed)
Reason for Consult: right arm cellulitis Referring Physician: [facility]  James Baker is an 46 y.o. male.  HPI: James Baker is a 46 y.o. male with medical history significant for Asthma.  Patient presented to ED with complaints of redness and pain to his right upper arm.  Symptoms started 3 days ago- 8/17 after a pipe fell on his right upper arm.  About 3 weeks ago, patient had 2 tattoos to the forearm over the same extremity.  He bought a tattoo gun and this tattoos on himself.  The second tattoo which is closer to his antecubital fossa developed an infection and became green and had yellowish-green post draining from it.  But reports this resolved before the pipe fell on his arm on Thursday.  He denies fever or chills.  He has no pain or swelling to any other joints or extremities.  He denies IV drug use, but reports he uses marijuana cocaine and abit of many things.  Denies alcohol use.  Past Medical History:  Diagnosis Date   Asthma     History reviewed. No pertinent surgical history.  Family History  Problem Relation Age of Onset   Cancer Mother    Stroke Father    Seizures Father    Cancer Other     Social History:  reports that he has been smoking cigarettes. He has a 15.00 pack-year smoking history. He has never used smokeless tobacco. He reports current alcohol use of about 3.0 standard drinks of alcohol per week. He reports that he does not use drugs.  Allergies:  Allergies  Allergen Reactions   Hydrocodone Itching    Medications: I have reviewed the patient's current medications. Scheduled:  [MAR Hold] acetaminophen  1,000 mg Oral TID   [MAR Hold] albuterol  2.5 mg Nebulization Once   [MAR Hold] nicotine  14 mg Transdermal Daily    Results for orders placed or performed during the hospital encounter of 07/20/22 (from the past 24 hour(s))  Resp Panel by RT-PCR (Flu A&B, Covid) Anterior Nasal Swab     Status: None   Collection Time: 07/20/22  4:13 PM    Specimen: Anterior Nasal Swab  Result Value Ref Range   SARS Coronavirus 2 by RT PCR NEGATIVE NEGATIVE   Influenza A by PCR NEGATIVE NEGATIVE   Influenza B by PCR NEGATIVE NEGATIVE  Blood Culture (routine x 2)     Status: None (Preliminary result)   Collection Time: 07/20/22  4:18 PM   Specimen: BLOOD LEFT FOREARM  Result Value Ref Range   Specimen Description      BLOOD LEFT FOREARM BOTTLES DRAWN AEROBIC AND ANAEROBIC   Special Requests Blood Culture adequate volume    Culture      NO GROWTH < 12 HOURS Performed at [facility], [address] [place], [place] [address]    Report Status PENDING   Lactic acid, plasma     Status: Abnormal   Collection Time: 07/20/22  4:33 PM  Result Value Ref Range   Lactic Acid, Venous 2.0 (HH) 0.5 - 1.9 mmol/L  Comprehensive metabolic panel     Status: Abnormal   Collection Time: 07/20/22  4:33 PM  Result Value Ref Range   Sodium 130 (L) 135 - 145 mmol/L   Potassium 3.8 3.5 - 5.1 mmol/L   Chloride 91 (L) 98 - 111 mmol/L   CO2 30 22 - 32 mmol/L   Glucose, Bld 122 (H) 70 - 99 mg/dL   BUN 13  6 - 20 mg/dL   Creatinine, Ser 0.81 0.61 - 1.24 mg/dL   Calcium 9.1 8.9 - 44.8 mg/dL   Total Protein 7.2 6.5 - 8.1 g/dL   Albumin 3.2 (L) 3.5 - 5.0 g/dL   AST 26 15 - 41 U/L   ALT 13 0 - 44 U/L   Alkaline Phosphatase 64 38 - 126 U/L   Total Bilirubin 1.1 0.3 - 1.2 mg/dL   GFR, Estimated >18 >56 mL/min   Anion gap 9 5 - 15  CBC with Differential     Status: Abnormal   Collection Time: 07/20/22  4:33 PM  Result Value Ref Range   WBC 22.1 (H) 4.0 - 10.5 K/uL   RBC 4.99 4.22 - 5.81 MIL/uL   Hemoglobin 13.4 13.0 - 17.0 g/dL   HCT 31.4 97.0 - 26.3 %   MCV 82.6 80.0 - 100.0 fL   MCH 26.9 26.0 - 34.0 pg   MCHC 32.5 30.0 - 36.0 g/dL   RDW 78.5 (H) 88.5 - 02.7 %   Platelets 346 150 - 400 K/uL   nRBC 0.0 0.0 - 0.2 %   Neutrophils Relative % 72 %   Neutro Abs 15.7 (H) 1.7 - 7.7 K/uL   Lymphocytes Relative 17 %   Lymphs Abs 3.7 0.7 - 4.0 K/uL   Monocytes  Relative 9 %   Monocytes Absolute 2.1 (H) 0.1 - 1.0 K/uL   Eosinophils Relative 1 %   Eosinophils Absolute 0.2 0.0 - 0.5 K/uL   Basophils Relative 0 %   Basophils Absolute 0.1 0.0 - 0.1 K/uL   Immature Granulocytes 1 %   Abs Immature Granulocytes 0.25 (H) 0.00 - 0.07 K/uL  Protime-INR     Status: None   Collection Time: 07/20/22  4:33 PM  Result Value Ref Range   Prothrombin Time 14.6 11.4 - 15.2 seconds   INR 1.2 0.8 - 1.2  APTT     Status: None   Collection Time: 07/20/22  4:33 PM  Result Value Ref Range   aPTT 30 24 - 36 seconds  Blood Culture (routine x 2)     Status: None (Preliminary result)   Collection Time: 07/20/22  4:33 PM   Specimen: Left Antecubital; Blood  Result Value Ref Range   Specimen Description      LEFT ANTECUBITAL BOTTLES DRAWN AEROBIC AND ANAEROBIC   Special Requests Blood Culture adequate volume    Culture      NO GROWTH < 12 HOURS Performed at [facility], [address], [place], [place] [address]    Report Status PENDING   Rapid urine drug screen (hospital performed)     Status: Abnormal   Collection Time: 07/20/22  6:25 PM  Result Value Ref Range   Opiates NONE DETECTED NONE DETECTED   Cocaine NONE DETECTED NONE DETECTED   Benzodiazepines POSITIVE (A) NONE DETECTED   Amphetamines POSITIVE (A) NONE DETECTED   Tetrahydrocannabinol POSITIVE (A) NONE DETECTED   Barbiturates NONE DETECTED NONE DETECTED  Lactic acid, plasma     Status: None   Collection Time: 07/20/22  6:56 PM  Result Value Ref Range   Lactic Acid, Venous 1.1 0.5 - 1.9 mmol/L  Osmolality     Status: None   Collection Time: 07/20/22  6:56 PM  Result Value Ref Range   Osmolality 276 275 - 295 mOsm/kg  MRSA Next Gen by PCR, Nasal     Status: None   Collection Time: 07/21/22 12:54 AM   Specimen: Nasal Mucosa; Nasal  Swab  Result Value Ref Range   MRSA by PCR Next Gen NOT DETECTED NOT DETECTED  Basic metabolic panel     Status: Abnormal   Collection Time: 07/21/22  5:33 AM  Result  Value Ref Range   Sodium 138 135 - 145 mmol/L   Potassium 3.6 3.5 - 5.1 mmol/L   Chloride 107 98 - 111 mmol/L   CO2 25 22 - 32 mmol/L   Glucose, Bld 111 (H) 70 - 99 mg/dL   BUN 14 6 - 20 mg/dL   Creatinine, Ser 3.53 0.61 - 1.24 mg/dL   Calcium 8.3 (L) 8.9 - 10.3 mg/dL   GFR, Estimated >61 >44 mL/min   Anion gap 6 5 - 15  CBC     Status: Abnormal   Collection Time: 07/21/22  5:33 AM  Result Value Ref Range   WBC 20.2 (H) 4.0 - 10.5 K/uL   RBC 4.37 4.22 - 5.81 MIL/uL   Hemoglobin 11.8 (L) 13.0 - 17.0 g/dL   HCT 31.5 (L) 40.0 - 86.7 %   MCV 83.8 80.0 - 100.0 fL   MCH 27.0 26.0 - 34.0 pg   MCHC 32.2 30.0 - 36.0 g/dL   RDW 61.9 50.9 - 32.6 %   Platelets 285 150 - 400 K/uL   nRBC 0.0 0.0 - 0.2 %    X-ray: CLINICAL DATA:  Trauma. Right arm pain. Pipe fell on arm at work on Thursday, self tattoo few weeks ago.   EXAM: RIGHT HUMERUS - 2+ VIEW   COMPARISON:  None Available.   FINDINGS: Cortical margins of the humerus are intact. There is no evidence of fracture or other focal bone lesions. Elbow and shoulder alignment are maintained. There is soft tissue gas overlying the mid upper aspect of the arm. Soft tissue edema is seen laterally.   IMPRESSION: 1. No fracture or dislocation. 2. Moderate volume patchy soft tissue gas overlying the mid upper arm. In the absence of laceration, this is suspicious for necrotizing soft tissue infection. Overlying soft tissue edema.   These results were called by telephone at the time of interpretation on 07/20/2022 at 4:56 pm to provider [facility] ZAMMIT , who verbally acknowledged these results.     Electronically Signed   By: Narda Rutherford M.D.  CLINICAL DATA:  Right upper extremity cellulitis.   EXAM: CT OF THE UPPER RIGHT EXTREMITY WITH CONTRAST   TECHNIQUE: Multidetector CT imaging of the upper right extremity was performed according to the standard protocol following intravenous contrast administration.   RADIATION DOSE  REDUCTION: This exam was performed according to the departmental dose-optimization program which includes automated exposure control, adjustment of the mA and/or kV according to patient size and/or use of iterative reconstruction technique.   CONTRAST:  79mL OMNIPAQUE IOHEXOL 300 MG/ML  SOLN   COMPARISON:  Radiograph performed earlier on the same date.   FINDINGS: Bones/Joint/Cartilage   No evidence of fracture or dislocation. No cortical erosion or periosteal reaction.   Ligaments   Suboptimally assessed by CT.   Muscles and Tendons   There is a large elongated peripherally enhancing low-density collection with gas in the subcutaneous soft tissues of the anterolateral arm. This process involves the anteroinferior deltoid and extends into the lateral aspect of the biceps muscle. In the deltoid this process measures at least 2.4 x 5.7 x 9.2 cm and superiorly in the anterior fibers of the deltoid it measures at least 2.4 x 1.9 x 6.2 cm.   Soft tissues   Subcutaneous  soft tissue edema and skin thickening suggesting cellulitis.   IMPRESSION: 1. Large peripherally enhancing abscess with large amount of gas on the anterolateral aspect of the arm involving the deltoid and biceps muscles. The larger component in the biceps measures at least 2.4 x 5.7 x 9.2 cm and smaller component in the deltoid measures at least 2.4 x 1.9 x 6.2 cm. Findings most consistent with necrotizing abscess/collection. Surgical consultation for further management is recommended.   2.  Skin thickening and subcutaneous soft tissue edema.   3.  No acute osseous abnormality.     Electronically Signed   By: Larose Hires D.O.  ROS As per HPI  Blood pressure 127/81, pulse (!) 108, temperature 98.9 F (37.2 C), temperature source Oral, resp. rate 20, height 5\' 10"  (1.778 m), weight 61.2 kg, SpO2 96 %.  Physical Exam:  Seen this am in hospital bed with his significant other in bed with  her  General: No acute distress, resting comfortably Cardiovascular: BUE warm and well perfused, normal rate Respiratory: Normal WOB on RA Skin: Warm and dry Neurologic: Sensation intact distally Psychiatric: Patient is at baseline mood and affect   RUE:   Significant swelling from [facility] fossa to anterior   Deltoid Erythema over anterior arm from [facility] fossa to anterior deltoid with mild erythema spreading over pec area Palpable fluctuance over bicep Full ROM of wrist and hand SILT throughout Hand warm and well perfused  Assessment/Plan: Significant right arm cellulitis with concern for abscess   Plan: I accepted this patient in transfer from [facility] ER with hopes of getting help to address the abscess. Thankfully Dr. [facility] has graciously agreed to help him due to the complicated nature of this infection involving the upper extremity    Frazier Butt 07/21/2022, 9:47 AM

## 2022-07-21 NOTE — Consult Note (Addendum)
HAND SURGERY CONSULTATION  REQUESTING PHYSICIAN: Alberteen Sam, *   Chief Complaint: Right arm pain  HPI: James Baker is a 46 y.o. male who presents with severe pain in his right upper arm with CT findings consistent with necrotizing abscess.  Patient tattoo'd himself in the right AC fossa a few weeks ago with what sounds like a local infection that resolved.  He was struck on his upper arm with a metal pipe on Thursday with progressing pain, swelling, and erythema.  His pain started around Saturday evening/Sunday morning. He is very uncomfortable today which started overnight.  His pain is localized to the deltoid, upper arm.  He has some pain over the anterior chest which is new.  He denies pain distal to the elbow.  He denies numbness or paresthesias.  He does not feel systemically ill.  Patient initially presented to [facility] yesterday afternoon and was transferred to [facility].  I was asked to evaluate patient his morning.     Past Medical History:  Diagnosis Date   Asthma    History reviewed. No pertinent surgical history. Social History   Socioeconomic History   Marital status: Single    Spouse name: Not on file   Number of children: Not on file   Years of education: Not on file   Highest education level: Not on file  Occupational History   Not on file  Tobacco Use   Smoking status: Every Day    Packs/day: 1.00    Years: 15.00    Total pack years: 15.00    Types: Cigarettes   Smokeless tobacco: Never  Vaping Use   Vaping Use: Never used  Substance and Sexual Activity   Alcohol use: Yes    Alcohol/week: 3.0 standard drinks of alcohol    Types: 3 Cans of beer per week    Comment: occasionally   Drug use: No   Sexual activity: Never  Other Topics Concern   Not on file  Social History Narrative   Not on file   Social Determinants of Health   Financial Resource Strain: Not on file  Food Insecurity: Not on file  Transportation Needs: Not on  file  Physical Activity: Not on file  Stress: Not on file  Social Connections: Not on file   Family History  Problem Relation Age of Onset   Cancer Mother    Stroke Father    Seizures Father    Cancer Other    - negative except otherwise stated in the family history section Allergies  Allergen Reactions   Hydrocodone Itching   Prior to Admission medications   Medication Sig Start Date End Date Taking? Authorizing Provider  acetaminophen (TYLENOL) 500 MG tablet Take 1,000 mg by mouth every 6 (six) hours as needed for moderate pain.   Yes [provider]   CT HUMERUS RIGHT W CONTRAST  Result Date: 07/20/2022 CLINICAL DATA:  Right upper extremity cellulitis. EXAM: CT OF THE UPPER RIGHT EXTREMITY WITH CONTRAST TECHNIQUE: Multidetector CT imaging of the upper right extremity was performed according to the standard protocol following intravenous contrast administration. RADIATION DOSE REDUCTION: This exam was performed according to the departmental dose-optimization program which includes automated exposure control, adjustment of the mA and/or kV according to patient size and/or use of iterative reconstruction technique. CONTRAST:  59mL OMNIPAQUE IOHEXOL 300 MG/ML  SOLN COMPARISON:  Radiograph performed earlier on the same date. FINDINGS: Bones/Joint/Cartilage No evidence of fracture or dislocation. No cortical erosion or periosteal reaction.  Ligaments Suboptimally assessed by CT. Muscles and Tendons There is a large elongated peripherally enhancing low-density collection with gas in the subcutaneous soft tissues of the anterolateral arm. This process involves the anteroinferior deltoid and extends into the lateral aspect of the biceps muscle. In the deltoid this process measures at least 2.4 x 5.7 x 9.2 cm and superiorly in the anterior fibers of the deltoid it measures at least 2.4 x 1.9 x 6.2 cm. Soft tissues Subcutaneous soft tissue edema and skin thickening suggesting cellulitis.  IMPRESSION: 1. Large peripherally enhancing abscess with large amount of gas on the anterolateral aspect of the arm involving the deltoid and biceps muscles. The larger component in the biceps measures at least 2.4 x 5.7 x 9.2 cm and smaller component in the deltoid measures at least 2.4 x 1.9 x 6.2 cm. Findings most consistent with necrotizing abscess/collection. Surgical consultation for further management is recommended. 2.  Skin thickening and subcutaneous soft tissue edema. 3.  No acute osseous abnormality. Electronically Signed   By: Imran  Ahmed D.O.   On: 07/20/2022 19:58   DG Humerus Right  Result Date: 07/20/2022 CLINICAL DATA:  Trauma. Right arm pain. Pipe fell on arm at work on Thursday, self tattoo few weeks ago. EXAM: RIGHT HUMERUS - 2+ VIEW COMPARISON:  None Available. FINDINGS: Cortical margins of the humerus are intact. There is no evidence of fracture or other focal bone lesions. Elbow and shoulder alignment are maintained. There is soft tissue gas overlying the mid upper aspect of the arm. Soft tissue edema is seen laterally. IMPRESSION: 1. No fracture or dislocation. 2. Moderate volume patchy soft tissue gas overlying the mid upper arm. In the absence of laceration, this is suspicious for necrotizing soft tissue infection. Overlying soft tissue edema. These results were called by telephone at the time of interpretation on 07/20/2022 at 4:56 pm to provider JOSEPH ZAMMIT , who verbally acknowledged these results. Electronically Signed   By: Melanie  Sanford M.D.   On: 07/20/2022 16:57   - Positive ROS: All other systems have been reviewed and were otherwise negative with the exception of those mentioned in the HPI and as above.  Physical Exam: General: No acute distress, resting comfortably Cardiovascular: BUE warm and well perfused, normal rate Respiratory: Normal WOB on RA Skin: Warm and dry Neurologic: Sensation intact distally Psychiatric: Patient is at baseline mood and  affect  Right Upper Extremity  Significant swelling from AC fossa to anterior   Deltoid Erythema over anterior arm from AC fossa to anterior deltoid with mild erythema spreading over pec area Palpable fluctuance over bicep Full ROM of wrist and hand SILT throughout Hand warm and well perfused  Assessment: 46 yo M w/ right upper extremity infection with some concern for severe abscess with possible necrotizing infection.   Plan: Discussed with patient and his family that he will need emergent surgical debridement Will transfer from [facility] straight to the [facility] OR given significantly better resources and high level of care Additional plan to follow after OR  Thank you for the consult and the opportunity to see Mr. Casserly  Georgianna Band Taishawn Smaldone, M.D. [facility] 7:51 AM     

## 2022-07-21 NOTE — Progress Notes (Addendum)
Pt doing well this evening.  Sleeping comfortably when I came in.  Pain much improved.  Pt overall feels much better. Erythema over anterior chest well and associated pain have greatly improved.  Erythema seems to be receeding in areas not covered by wound vac adhesive.  No pain w/ full AROM of wrist and fingers.   Intraop cultures pending  Marlyne Beards, M.D. [facility]

## 2022-07-21 NOTE — Interval H&P Note (Signed)
History and Physical Interval Note:  07/21/2022 9:13 AM  James Baker  has presented today for surgery, with the diagnosis of Cellultis of Right Arm.  The various methods of treatment have been discussed with the patient and family. After consideration of risks, benefits and other options for treatment, the patient has consented to  Procedure(s): IRRIGATION AND DEBRIDEMENT EXTREMITY (Right) as a surgical intervention.  The patient's history has been reviewed, patient examined, no change in status, stable for surgery.  I have reviewed the patient's chart and labs.  Questions were answered to the patient's satisfaction.     Whittley Carandang Dae Highley

## 2022-07-21 NOTE — Progress Notes (Signed)
   07/21/22 0232  Assess: MEWS Score  Temp (!) 102 F (38.9 C)  BP 102/67  MAP (mmHg) 79  Pulse Rate (!) 113  Resp 17  Level of Consciousness Alert  SpO2 97 %  O2 Device Room Air  Assess: MEWS Score  MEWS Temp 2  MEWS Systolic 0  MEWS Pulse 2  MEWS RR 0  MEWS LOC 0  MEWS Score 4  MEWS Score Color Red  Assess: if the MEWS score is Yellow or Red  Were vital signs taken at a resting state? Yes  Focused Assessment No change from prior assessment  Does the patient meet 2 or more of the SIRS criteria? Yes  Does the patient have a confirmed or suspected source of infection? Yes  Provider and Rapid Response Notified? Yes  MEWS guidelines implemented *See Row Information* Yes  Treat  MEWS Interventions Administered prn meds/treatments;Escalated (See documentation below)  Pain Scale 0-10  Pain Score 6  Pain Type Acute pain  Pain Location Arm  Pain Orientation Right  Pain Intervention(s) Medication (See eMAR)  Complains of Fever  Interventions Medication (see MAR);Cold pack  Take Vital Signs  Increase Vital Sign Frequency  Red: Q 1hr X 4 then Q 4hr X 4, if remains red, continue Q 4hrs  Escalate  MEWS: Escalate Red: discuss with charge nurse/RN and provider, consider discussing with RRT  Notify: Charge Nurse/RN  Name of Charge Nurse/RN Notified Irving Burton RN  Date Charge Nurse/RN Notified 07/21/22  Time Charge Nurse/RN Notified 0232  Notify: Provider  Provider Name/Title [place]  Date Provider Notified 07/21/22  Time Provider Notified 0235  Method of Notification Page  Notification Reason Change in status  Provider response No new orders  Date of Provider Response 07/21/22  Time of Provider Response 0235 (marked as seen. no direct response.)  Notify: Rapid Response  Name of Rapid Response RN Notified Erin RN  Date Rapid Response Notified 07/21/22  Time Rapid Response Notified 0240  Document  Progress note created (see row info) Yes  Assess: SIRS CRITERIA  SIRS Temperature  1   SIRS Pulse 1  SIRS Respirations  0  SIRS WBC 1  SIRS Score Sum  3

## 2022-07-22 ENCOUNTER — Encounter (HOSPITAL_COMMUNITY): Payer: Self-pay | Admitting: Orthopedic Surgery

## 2022-07-22 DIAGNOSIS — R6520 Severe sepsis without septic shock: Secondary | ICD-10-CM | POA: Diagnosis not present

## 2022-07-22 DIAGNOSIS — A419 Sepsis, unspecified organism: Secondary | ICD-10-CM | POA: Diagnosis not present

## 2022-07-22 LAB — BASIC METABOLIC PANEL
Anion gap: 5 (ref 5–15)
BUN: 16 mg/dL (ref 6–20)
CO2: 25 mmol/L (ref 22–32)
Calcium: 8.8 mg/dL — ABNORMAL LOW (ref 8.9–10.3)
Chloride: 111 mmol/L (ref 98–111)
Creatinine, Ser: 0.73 mg/dL (ref 0.61–1.24)
GFR, Estimated: >60 mL/min (ref >60)
Glucose, Bld: 159 mg/dL — ABNORMAL HIGH (ref 70–99)
Potassium: 3.9 mmol/L (ref 3.5–5.1)
Sodium: 141 mmol/L (ref 135–145)

## 2022-07-22 LAB — CBC
HCT: 32.7 % — ABNORMAL LOW (ref 39.0–52.0)
Hemoglobin: 10.4 g/dL — ABNORMAL LOW (ref 13.0–17.0)
MCH: 26.8 pg (ref 26.0–34.0)
MCHC: 31.8 g/dL (ref 30.0–36.0)
MCV: 84.3 fL (ref 80.0–100.0)
Platelets: 320 10*3/uL (ref 150–400)
RBC: 3.88 MIL/uL — ABNORMAL LOW (ref 4.22–5.81)
RDW: 15.9 % — ABNORMAL HIGH (ref 11.5–15.5)
WBC: 29.2 10*3/uL — ABNORMAL HIGH (ref 4.0–10.5)
nRBC: 0 % (ref 0.0–0.2)

## 2022-07-22 LAB — ACID FAST SMEAR (AFB, MYCOBACTERIA): Acid Fast Smear: NEGATIVE

## 2022-07-22 MED ORDER — POVIDONE-IODINE 10 % EX SWAB: 2.0000 | Freq: Once | CUTANEOUS | Status: DC

## 2022-07-22 MED ORDER — CHLORHEXIDINE GLUCONATE 4 % EX LIQD
60.0000 mL | Freq: Once | CUTANEOUS | Status: DC
  Filled 2022-07-22: qty 60

## 2022-07-22 MED ORDER — LINEZOLID 600 MG/300ML IV SOLN
600.0000 mg | Freq: Two times a day (BID) | INTRAVENOUS | Status: AC
Start: 2022-07-22 — End: 2022-07-25
  Administered 2022-07-22 – 2022-07-25 (×6): 600 mg via INTRAVENOUS
  Filled 2022-07-22 (×7): qty 300

## 2022-07-22 MED ORDER — CEFAZOLIN SODIUM-DEXTROSE 2-4 GM/100ML-% IV SOLN
2.0000 g | INTRAVENOUS | Status: AC
  Administered 2022-07-23: 2 g via INTRAVENOUS
  Filled 2022-07-22: qty 100

## 2022-07-22 MED ORDER — PIPERACILLIN-TAZOBACTAM 3.375 G IVPB
3.3750 g | Freq: Three times a day (TID) | INTRAVENOUS | Status: DC
Start: 2022-07-22 — End: 2022-07-24
  Administered 2022-07-22 – 2022-07-24 (×6): 3.375 g via INTRAVENOUS
  Filled 2022-07-22 (×9): qty 50

## 2022-07-22 NOTE — Progress Notes (Signed)
   Subjective:  Pt sleeping comfortably this AM.  Pain much improved from yesterday.  Denies pain over anterior chest, posterior arm, or distal to AC fossa.  Pain over anterior upper arm continues to improve.  Denies systemic symptoms this AM.   Objective:   VITALS:   Vitals:   07/21/22 1355 07/21/22 1824 07/21/22 2222 07/22/22 0500  BP: 120/81 102/71 102/71 116/80  Pulse: 88 76 83 74  Resp: 16 20 20 20   Temp: 97.7 F (36.5 C) 97.9 F (36.6 C) 97.6 F (36.4 C)   TempSrc: Oral Oral    SpO2: 96% 97% 96% 97%  Weight:      Height:        Gen: NAD, resting comfortably, alert and conversant upon waking Pulm: Normal WOB on RA CV: BUE warm and well perfused, normal rate RUE: WVAC intact w/ good seal, erythema receeding from previously outlined areas, no pain w/ palpation of chest, posterior arm, forearm, or hand, no pain w/ ROM of wrist or fingers, SILT throughout    Lab Results  Component Value Date   WBC 29.2 (H) 07/22/2022   HGB 10.4 (L) 07/22/2022   HCT 32.7 (L) 07/22/2022   MCV 84.3 07/22/2022   PLT 320 07/22/2022     Assessment/Plan:  46 yo M POD 1 s/p irrigation and excisional debridement of anterior right upper arm w/ excision of nonviable portions of pectoralis, anterior deltoid, and anterior biceps muscle fibers.  Large abscess encountered without evidence of fluid tracking along fascial planes.  Aside from a few small areas of necrosis, muscle was red, healthy, and contracted with bovie stimulation.  Continue broad spectrum IV abx Intraop cultures w/ polymicrobial growth with GNRs, GPRs, GPCs Pt to undergo repeat I&D tomorrow with Dr. 59 at [facility] OR Discussed with patient that he may need multiple debridements pending appearance of the wound and underlying muscle tissue  [facility], MD 07/22/2022, 10:20 AM ([id number]) 07/24/2022

## 2022-07-22 NOTE — Progress Notes (Signed)
Patient is transferred to 2W Med/Sur unit at [facility] hospital. Report was given by phone call to RN Elene at 2035. [facility] is set up.

## 2022-07-22 NOTE — Progress Notes (Signed)
Pharmacy Antibiotic Note  James Baker is a 46 y.o. male admitted on 07/20/2022 with cellulitis.  Pharmacy has been consulted for Zosyn dosing.  I&D of R upper arm with some small areas of necrosis. Abscess cx growing GNRs, GPC in pairs and chains, and GPRs. Most likely will need multiple debridements.  Plan: Start Zosyn 3.375 gm IV q8h (4 hour infusion) Switch vancomycin to linezolid 600mg  IV Q12h Monitor clinical picture, renal function F/U C&S, abx deescalation / LOT  Height: 5\' 10"  (177.8 cm) Weight: 61.2 kg (135 lb) IBW/kg (Calculated) : 73  Temp (24hrs), Avg:97.7 F (36.5 C), Min:97.6 F (36.4 C), Max:97.9 F (36.6 C)  Recent Labs  Lab 07/20/22 1633 07/20/22 1856 07/21/22 0533 07/22/22 0544  WBC 22.1*  --  20.2* 29.2*  CREATININE 0.97  --  0.96 0.73  LATICACIDVEN 2.0* 1.1  --   --     Estimated Creatinine Clearance: 102 mL/min (by C-G formula based on SCr of 0.73 mg/dL).    Allergies  Allergen Reactions   Hydrocodone Itching   Thank you for allowing pharmacy to be a part of this patient's care.  07/23/22 07/22/2022 1:55 PM

## 2022-07-22 NOTE — Progress Notes (Signed)
PROGRESS NOTE    James Baker  WUJ:[id number] DOB: 1976-05-15 DOA: 07/20/2022 PCP: Patient, No Pcp Per    Brief Narrative:   James Baker is a 46 y.o. male with past medical history significant for tobacco use disorder who presented to [facility] ED on 8/20 with progressive right arm pain, swelling, redness.  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  Additionally, patient 3 weeks prior gave himself a self tattoo to his anterior aspect of his right forearm.  Patient reports that this tattoo area did develop redness with yellow/green drainage surrounding it; but he reports this resolved before the pipe fell on his arm on Thursday.  He denied fever/chills.  Denies IV drug use but did admit to marijuana/cocaine intermittently.  Denies alcohol use.  In the ED, temperature 99.3 F, HR 123, BP 142/101, RR 19, SPO2 96% on room air.  WBC 22.1, hemoglobin 13.4, platelets 346.  Sodium 130, potassium 3.8, chloride 91, CO2 30, glucose 122, BUN 13, creatinine 0.97.  AST 26, ALT 13, total bilirubin 1.1.  Lactic acid 2.0. COVID-19 PCR negative.  Influenza A/B PCR negative.  Urine osmolality 410, urine sodium 22.  UDS positive for amphetamines, benzos, THC.  Right humerus x-ray with no fracture/dislocation, moderate volume patchy soft tissue gas overlying the mid upper arm suspicious for necrotizing soft tissue infection.  CT right humerus with contrast with large peripherally enhancing abscess with large amount of gas on the anterior lateral aspect of the arm involving the deltoid and biceps muscles, larger component in the bicep measuring at least 2.4 x 5.7 x 90.2 cm and smaller component measuring 2.4 x 1.9 x 6.2 cm consistent with necrotizing abscess.  Orthopedics was consulted, hand surgery Dr. Frazier ButtBenfield.  [facility] and  management.  Assessment & Plan:   Right upper extremity necrotizing abscess/soft tissue infection Patient presenting as a transfer from [facility] for progressive right upper extremity pain, swelling and erythema.  Patient was tachycardic, tachypneic with temperature nine 9.3 and WBC 22.2 with a lactic acid of 2.0.  Imaging notable for large peripherally enhancing abscess with large amount of gas on the anterior lateral aspect of the arm involving the deltoid and biceps muscles, larger component in the bicep measuring at least 2.4 x 5.7 x 90.2 cm and smaller component measuring 2.4 x 1.9 x 6.2 cm consistent with necrotizing abscess.  Patient underwent irrigation and debridement of right upper extremity by Dr. Frazier ButtBenfield and Dr. Steward DroneBokshan on 07/21/2022 with wound VAC placement. --Orthopedics following, appreciate assistance --Continue vancomycin in favor of linezolid --Discontinue ceftriaxone in favor of Zosyn given necrotizing infection --Blood cultures x2: No growth x2 days --Operative cultures with moderate GNR, GPC and few gram-positive rods; awaiting further identification and susceptibilities --Orthopedics plans for further I&D on 8/23 with Dr. Lajoyce Cornersuda --NPO at 0430 on 8/23 --CBC daily  Tobacco use disorder Counseled on need for complete cessation  Polysubstance abuse Patient admits to occasional cocaine/THC use.  Denies IV drug abuse.  UDS positive for amphetamines, benzos and THC.  Counseled on need for complete cessation.  DVT prophylaxis: SCDs Start: 07/21/22 0100    Code Status: Full Code Family Communication: Updated family present at bedside  Disposition Plan:  Level of care: Med-Surg Status is: Inpatient Remains inpatient appropriate because: IV antibiotics,    Consultants:  Orthopedics, Dr. Frazier ButtBenfield  Procedures:  Irrigation  and debridements, RUE; Dr. Frazier Butt, Dr. Steward Drone 8/21  Antimicrobials:  Vancomycin 8/20 - 8/22 Ceftriaxone 8/20 - 8/22 Flagyl 8/20 -  8/22 Linezolid 8/22>> Zosyn 8/22>>   Subjective: Patient seen and examined at bedside, resting comfortably.  Family present.  Sleeping but easily arousable.  Patient has concerns regarding what his wound will eventually look like.  Discussed with him will likely need further irrigation debridement which is scheduled for tomorrow with Dr. Lajoyce Corners.  Wound VAC remains in place.  Pain currently controlled with just minimal soreness.  No other specific questions or concerns at this time.  Denies headache, no visual changes, no chest pain, no palpitations, no fever/chills/night sweats, no nausea cefonicid diarrhea, no focal weakness, no abdominal pain, no fatigue, no cough/congestion, no paresthesias.  No acute events overnight per nursing staff.  Objective: Vitals:   07/21/22 1824 07/21/22 2222 07/22/22 0500 07/22/22 1327  BP: 102/71 102/71 116/80 96/67  Pulse: 76 83 74 80  Resp: 20 20 20 18   Temp: 97.9 F (36.6 C) 97.6 F (36.4 C)  97.6 F (36.4 C)  TempSrc: Oral   Oral  SpO2: 97% 96% 97% 94%  Weight:      Height:        Intake/Output Summary (Last 24 hours) at 07/22/2022 1403 Last data filed at 07/22/2022 0600 Gross per 24 hour  Intake 687 ml  Output 900 ml  Net -213 ml   Filed Weights   07/20/22 1600 07/21/22 0932  Weight: 61.2 kg 61.2 kg    Examination:  Physical Exam: GEN: NAD, alert and oriented x 3, thin in appearance HEENT: NCAT, PERRL, EOMI, sclera clear, MMM PULM: CTAB w/o wheezes/crackles, normal respiratory effort, on room air CV: RRR w/o M/G/R GI: abd soft, NTND, NABS, no R/G/M MSK: Right upper extremity with wound VAC in place to right upper arm with mild erythema surrounding sponged from wound VAC, mild tenderness to palpation, otherwise no other concerning findings, neurovascularly intact, no peripheral edema, muscle strength globally intact 5/5 bilateral upper/lower extremities NEURO: CN II-XII intact, no focal deficits, sensation to light touch intact PSYCH:  normal mood/affect Integumentary: Right upper extremity wound with wound VAC as above, otherwise no other concerning rashes/lesions/wounds noted to exposed skin.     Data Reviewed: I have personally reviewed following labs and imaging studies  CBC: Recent Labs  Lab 07/20/22 1633 07/21/22 0533 07/22/22 0544  WBC 22.1* 20.2* 29.2*  NEUTROABS 15.7*  --   --   HGB 13.4 11.8* 10.4*  HCT 41.2 36.6* 32.7*  MCV 82.6 83.8 84.3  PLT 346 285 320   Basic Metabolic Panel: Recent Labs  Lab 07/20/22 1633 07/21/22 0533 07/22/22 0544  NA 130* 138 141  K 3.8 3.6 3.9  CL 91* 107 111  CO2 30 25 25   GLUCOSE 122* 111* 159*  BUN 13 14 16   CREATININE 0.97 0.96 0.73  CALCIUM 9.1 8.3* 8.8*   GFR: Estimated Creatinine Clearance: 102 mL/min (by C-G formula based on SCr of 0.73 mg/dL). Liver Function Tests: Recent Labs  Lab 07/20/22 1633  AST 26  ALT 13  ALKPHOS 64  BILITOT 1.1  PROT 7.2  ALBUMIN 3.2*   No results for input(s): "LIPASE", "AMYLASE" in the last 168 hours. No results for input(s): "AMMONIA" in the last 168 hours. Coagulation Profile: Recent Labs  Lab 07/20/22 1633  INR 1.2   Cardiac Enzymes: No results for input(s): "CKTOTAL", "CKMB", "CKMBINDEX", "TROPONINI" in the last 168 hours. BNP (last 3 results)  No results for input(s): "PROBNP" in the last 8760 hours. HbA1C: No results for input(s): "HGBA1C" in the last 72 hours. CBG: No results for input(s): "GLUCAP" in the last 168 hours. Lipid Profile: No results for input(s): "CHOL", "HDL", "LDLCALC", "TRIG", "CHOLHDL", "LDLDIRECT" in the last 72 hours. Thyroid Function Tests: No results for input(s): "TSH", "T4TOTAL", "FREET4", "T3FREE", "THYROIDAB" in the last 72 hours. Anemia Panel: No results for input(s): "VITAMINB12", "FOLATE", "FERRITIN", "TIBC", "IRON", "RETICCTPCT" in the last 72 hours. Sepsis Labs: Recent Labs  Lab 07/20/22 1633 07/20/22 1856  LATICACIDVEN 2.0* 1.1    Recent Results (from the past  240 hour(s))  Resp Panel by RT-PCR (Flu A&B, Covid) Anterior Nasal Swab     Status: None   Collection Time: 07/20/22  4:13 PM   Specimen: Anterior Nasal Swab  Result Value Ref Range Status   SARS Coronavirus 2 by RT PCR NEGATIVE NEGATIVE Final    Comment: (NOTE) SARS-CoV-2 target nucleic acids are NOT DETECTED.  The SARS-CoV-2 RNA is generally detectable in upper respiratory specimens during the acute phase of infection. The lowest concentration of SARS-CoV-2 viral copies this assay can detect is 138 copies/mL. A negative result does not preclude SARS-Cov-2 infection and should not be used as the sole basis for treatment or other patient management decisions. A negative result may occur with  improper specimen collection/handling, submission of specimen other than nasopharyngeal swab, presence of viral mutation(s) within the areas targeted by this assay, and inadequate number of viral copies(<138 copies/mL). A negative result must be combined with clinical observations, patient history, and epidemiological information. The expected result is Negative.  Fact Sheet for Patients:  BloggerCourse.com  Fact Sheet for Healthcare Providers:  SeriousBroker.it  This test is no t yet approved or cleared by the [place] FDA and  has been authorized for detection and/or diagnosis of SARS-CoV-2 by FDA under an Emergency Use Authorization (EUA). This EUA will remain  in effect (meaning this test can be used) for the duration of the COVID-19 declaration under Section 564(b)(1) of the Act, 21 U.S.C.section 360bbb-3(b)(1), unless the authorization is terminated  or revoked sooner.       Influenza A by PCR NEGATIVE NEGATIVE Final   Influenza B by PCR NEGATIVE NEGATIVE Final    Comment: (NOTE) The Xpert Xpress SARS-CoV-2/FLU/RSV plus assay is intended as an aid in the diagnosis of influenza from Nasopharyngeal swab specimens and should not  be used as a sole basis for treatment. Nasal washings and aspirates are unacceptable for Xpert Xpress SARS-CoV-2/FLU/RSV testing.  Fact Sheet for Patients: BloggerCourse.com  Fact Sheet for Healthcare Providers: SeriousBroker.it  This test is not yet approved or cleared by the [place] FDA and has been authorized for detection and/or diagnosis of SARS-CoV-2 by FDA under an Emergency Use Authorization (EUA). This EUA will remain in effect (meaning this test can be used) for the duration of the COVID-19 declaration under Section 564(b)(1) of the Act, 21 U.S.C. section 360bbb-3(b)(1), unless the authorization is terminated or revoked.  Performed at [facility], [address], [place], [place] [address]   Blood Culture (routine x 2)     Status: None (Preliminary result)   Collection Time: 07/20/22  4:18 PM   Specimen: BLOOD LEFT FOREARM  Result Value Ref Range Status   Specimen Description   Final    BLOOD LEFT FOREARM BOTTLES DRAWN AEROBIC AND ANAEROBIC   Special Requests Blood Culture adequate volume  Final   Culture   Final    NO  GROWTH 2 DAYS Performed at [facility], [address] [place], [place] [address]    Report Status PENDING  Incomplete  Blood Culture (routine x 2)     Status: None (Preliminary result)   Collection Time: 07/20/22  4:33 PM   Specimen: Left Antecubital; Blood  Result Value Ref Range Status   Specimen Description   Final    LEFT ANTECUBITAL BOTTLES DRAWN AEROBIC AND ANAEROBIC   Special Requests Blood Culture adequate volume  Final   Culture   Final    NO GROWTH 2 DAYS Performed at [facility], [address], [place], [place] [address]    Report Status PENDING  Incomplete  MRSA Next Gen by PCR, Nasal     Status: None   Collection Time: 07/21/22 12:54 AM   Specimen: Nasal Mucosa; Nasal Swab  Result Value Ref Range Status   MRSA by PCR Next Gen NOT DETECTED NOT DETECTED Final    Comment:  (NOTE) The GeneXpert MRSA Assay (FDA approved for NASAL specimens only), is one component of a comprehensive MRSA colonization surveillance program. It is not intended to diagnose MRSA infection nor to guide or monitor treatment for MRSA infections. Test performance is not FDA approved in patients less than 36 years old. Performed at [facility], [address] [place], [place] [address]   Aerobic/Anaerobic Culture w Gram Stain (surgical/deep wound)     Status: None (Preliminary result)   Collection Time: 07/21/22 10:23 AM   Specimen: PATH Other; Tissue  Result Value Ref Range Status   Specimen Description ABSCESS  Final   Special Requests  RIGHT UPPER ARM  Final   Gram Stain   Final    MODERATE WBC PRESENT,BOTH PMN AND MONONUCLEAR MODERATE GRAM NEGATIVE RODS MODERATE GRAM POSITIVE COCCI IN PAIRS AND CHAINS FEW GRAM POSITIVE RODS    Culture   Final    CULTURE REINCUBATED FOR BETTER GROWTH Performed at [facility], [address] [place], [place] [address]    Report Status PENDING  Incomplete         Radiology Studies: CT HUMERUS RIGHT W CONTRAST  Result Date: 07/20/2022 CLINICAL DATA:  Right upper extremity cellulitis. EXAM: CT OF THE UPPER RIGHT EXTREMITY WITH CONTRAST TECHNIQUE: Multidetector CT imaging of the upper right extremity was performed according to the standard protocol following intravenous contrast administration. RADIATION DOSE REDUCTION: This exam was performed according to the departmental dose-optimization program which includes automated exposure control, adjustment of the mA and/or kV according to patient size and/or use of iterative reconstruction technique. CONTRAST:  64mL OMNIPAQUE IOHEXOL 300 MG/ML  SOLN COMPARISON:  Radiograph performed earlier on the same date. FINDINGS: Bones/Joint/Cartilage No evidence of fracture or dislocation. No cortical erosion or periosteal reaction. Ligaments Suboptimally assessed by CT. Muscles and  Tendons There is a large elongated peripherally enhancing low-density collection with gas in the subcutaneous soft tissues of the anterolateral arm. This process involves the anteroinferior deltoid and extends into the lateral aspect of the biceps muscle. In the deltoid this process measures at least 2.4 x 5.7 x 9.2 cm and superiorly in the anterior fibers of the deltoid it measures at least 2.4 x 1.9 x 6.2 cm. Soft tissues Subcutaneous soft tissue edema and skin thickening suggesting cellulitis. IMPRESSION: 1. Large peripherally enhancing abscess with large amount of gas on the anterolateral aspect of the arm involving the deltoid and biceps muscles. The larger component in the biceps measures at least 2.4 x 5.7 x 9.2 cm and  smaller component in the deltoid measures at least 2.4 x 1.9 x 6.2 cm. Findings most consistent with necrotizing abscess/collection. Surgical consultation for further management is recommended. 2.  Skin thickening and subcutaneous soft tissue edema. 3.  No acute osseous abnormality. Electronically Signed   By: Larose Hires D.O.   On: 07/20/2022 19:58   DG Humerus Right  Result Date: 07/20/2022 CLINICAL DATA:  Trauma. Right arm pain. Pipe fell on arm at work on Thursday, self tattoo few weeks ago. EXAM: RIGHT HUMERUS - 2+ VIEW COMPARISON:  None Available. FINDINGS: Cortical margins of the humerus are intact. There is no evidence of fracture or other focal bone lesions. Elbow and shoulder alignment are maintained. There is soft tissue gas overlying the mid upper aspect of the arm. Soft tissue edema is seen laterally. IMPRESSION: 1. No fracture or dislocation. 2. Moderate volume patchy soft tissue gas overlying the mid upper arm. In the absence of laceration, this is suspicious for necrotizing soft tissue infection. Overlying soft tissue edema. These results were called by telephone at the time of interpretation on 07/20/2022 at 4:56 pm to provider [facility] ZAMMIT , who verbally acknowledged  these results. Electronically Signed   By: Narda Rutherford M.D.   On: 07/20/2022 16:57        Scheduled Meds:  acetaminophen  1,000 mg Oral TID   albuterol  2.5 mg Nebulization Once   nicotine  14 mg Transdermal Daily   Continuous Infusions:  linezolid (ZYVOX) IV     piperacillin-tazobactam (ZOSYN)  IV       LOS: 2 days    Time spent: 51 minutes spent on chart review, discussion with nursing staff, consultants, updating family and interview/physical exam; more than 50% of that time was spent in counseling and/or coordination of care.    Alvira Philips [place], DO [facility] Available via Epic secure chat 7am-7pm After these hours, please refer to coverage provider listed on [email or web address] 07/22/2022, 2:03 PM

## 2022-07-22 NOTE — TOC Initial Note (Addendum)
Transition of Care [facility]) - Initial/Assessment Note    Patient Details  Name: James Baker MRN: [id number] Date of Birth: Jun 17, 1976  [facility] CM/SW Contact:    Vassie Moselle, LCSW Phone Number: 07/22/2022, 10:04 AM  Clinical Narrative:                 Met with pt and s/o at bedside to discuss resources available. Pt is agreeable to having substance use resources added to his discharge paperwork. SA resources have been added to AVS. Pt also reports he does not have a PCP and has struggled to be connected with PCP as many are not accepting new patients. Pt was agreeable for CSW to assist with obtaining PCP services for pt prior to discharge. CSW left voicemail with [facility] and [facility] to refer pt for PCP and additional resources.   Update 1030: Received return call from [facility] who share that pt will have to be screened by [facility] first and then will be referred to their clinic by [facility]. CSW awaiting return call from [facility].   Expected Discharge Plan: Home/Self Care Barriers to Discharge: Continued Medical Work up   Patient Goals and CMS Choice Patient states their goals for this hospitalization and ongoing recovery are:: To go home   Choice offered to / list presented to : Patient  Expected Discharge Plan and Services Expected Discharge Plan: Home/Self Care In-house Referral: NA Discharge Planning Services: NA Post Acute Care Choice: NA Living arrangements for the past 2 months: Single Family Home                 DME Arranged: N/A DME Agency: NA                  Prior Living Arrangements/Services Living arrangements for the past 2 months: Single Family Home Lives with:: Self Patient language and need for interpreter reviewed:: Yes Do you feel safe going back to the place where you live?: Yes      Need for Family Participation in Patient Care: No (Comment) Care giver  support system in place?: No (comment)   Criminal Activity/Legal Involvement Pertinent to Current Situation/Hospitalization: No - Comment as needed  Activities of Daily Living Home Assistive Devices/Equipment: None ADL Screening (condition at time of admission) Patient's cognitive ability adequate to safely complete daily activities?: Yes Is the patient deaf or have difficulty hearing?: No Does the patient have difficulty seeing, even when wearing glasses/contacts?: No Does the patient have difficulty concentrating, remembering, or making decisions?: No Patient able to express need for assistance with ADLs?: Yes Does the patient have difficulty dressing or bathing?: No Independently performs ADLs?: Yes (appropriate for developmental age) Does the patient have difficulty walking or climbing stairs?: No Weakness of Legs: None Weakness of Arms/Hands: None  Permission Sought/Granted   Permission granted to share information with : No              Emotional Assessment Appearance:: Appears stated age Attitude/Demeanor/Rapport: Engaged, Gracious Affect (typically observed): Accepting Orientation: : Oriented to Self, Oriented to Place, Oriented to  Time, Oriented to Situation Alcohol / Substance Use: Illicit Drugs Psych Involvement: No (comment)  Admission diagnosis:  Cellulitis of right upper extremity [L03.113] Patient Active Problem List   Diagnosis Date Noted   Necrotizing cellulitis of right arm 07/20/2022   Asthma 07/20/2022   Severe sepsis (HCC) 07/20/2022   Hyponatremia 07/20/2022   Tobacco  abuse 07/20/2022   PCP:  Patient, No Pcp Per Pharmacy:   [facility] [place] [facility] [address] [place] [address] Phone: [phone] Fax: [phone]     Social Determinants of Health (SDOH) Interventions    Readmission Risk Interventions    07/22/2022   10:02 AM  Readmission Risk Prevention Plan  Post Dischage Appt Complete   Medication Screening Complete  Transportation Screening Complete

## 2022-07-22 NOTE — Plan of Care (Signed)

## 2022-07-22 NOTE — Progress Notes (Signed)
Pt arrived on unit, was informed by transport pt tripped over wound vac tubing before leaving prior facility and pulled tubing out... compression wrap put on to hold in place wound vac off on arrival.... assessed seal, new Tegaderm placed on vac tubing to allow for suction.Marland Kitchenwound vac placed back on, provider made aware, pt aware of procedure scheduled for am, NPO at 12am, family at bedside, all needs addressed

## 2022-07-23 ENCOUNTER — Other Ambulatory Visit: Payer: Self-pay

## 2022-07-23 ENCOUNTER — Encounter (HOSPITAL_COMMUNITY): Payer: Self-pay | Admitting: Internal Medicine

## 2022-07-23 ENCOUNTER — Inpatient Hospital Stay (HOSPITAL_COMMUNITY): Payer: 59 | Admitting: Anesthesiology

## 2022-07-23 ENCOUNTER — Inpatient Hospital Stay (HOSPITAL_COMMUNITY): Payer: Self-pay | Admitting: Anesthesiology

## 2022-07-23 ENCOUNTER — Encounter (HOSPITAL_COMMUNITY)
Admission: EM | Discharge status: Against Medical Advice | Payer: Self-pay | Source: Home / Self Care | Attending: Internal Medicine

## 2022-07-23 DIAGNOSIS — L02413 Cutaneous abscess of right upper limb: Secondary | ICD-10-CM | POA: Diagnosis not present

## 2022-07-23 DIAGNOSIS — I96 Gangrene, not elsewhere classified: Secondary | ICD-10-CM

## 2022-07-23 DIAGNOSIS — L03113 Cellulitis of right upper limb: Principal | ICD-10-CM

## 2022-07-23 DIAGNOSIS — M726 Necrotizing fasciitis: Secondary | ICD-10-CM | POA: Diagnosis not present

## 2022-07-23 DIAGNOSIS — L0390 Cellulitis, unspecified: Secondary | ICD-10-CM | POA: Diagnosis not present

## 2022-07-23 DIAGNOSIS — Z720 Tobacco use: Secondary | ICD-10-CM | POA: Diagnosis not present

## 2022-07-23 DIAGNOSIS — A419 Sepsis, unspecified organism: Secondary | ICD-10-CM | POA: Diagnosis not present

## 2022-07-23 DIAGNOSIS — R6520 Severe sepsis without septic shock: Secondary | ICD-10-CM | POA: Diagnosis not present

## 2022-07-23 HISTORY — PX: I & D EXTREMITY: SHX5045

## 2022-07-23 HISTORY — PX: APPLICATION OF WOUND VAC: SHX5189

## 2022-07-23 LAB — CBC WITH DIFFERENTIAL/PLATELET
Abs Immature Granulocytes: 0 10*3/uL (ref 0.00–0.07)
Abs Immature Granulocytes: 0.29 10*3/uL — ABNORMAL HIGH (ref 0.00–0.07)
Basophils Absolute: 0 10*3/uL (ref 0.0–0.1)
Basophils Absolute: 0.1 10*3/uL (ref 0.0–0.1)
Basophils Relative: 0 %
Basophils Relative: 0 %
Eosinophils Absolute: 0 10*3/uL (ref 0.0–0.5)
Eosinophils Absolute: 0.1 10*3/uL (ref 0.0–0.5)
Eosinophils Relative: 0 %
Eosinophils Relative: 0 %
HCT: 28.4 % — ABNORMAL LOW (ref 39.0–52.0)
HCT: 35.3 % — ABNORMAL LOW (ref 39.0–52.0)
Hemoglobin: 9.4 g/dL — ABNORMAL LOW (ref 13.0–17.0)
Hemoglobin: 11.2 g/dL — ABNORMAL LOW (ref 13.0–17.0)
Immature Granulocytes: 1 %
Lymphocytes Relative: 13 %
Lymphocytes Relative: 23 %
Lymphs Abs: 3.4 10*3/uL (ref 0.7–4.0)
Lymphs Abs: 5.6 10*3/uL — ABNORMAL HIGH (ref 0.7–4.0)
MCH: 27.2 pg (ref 26.0–34.0)
MCH: 26.5 pg (ref 26.0–34.0)
MCHC: 33.1 g/dL (ref 30.0–36.0)
MCHC: 31.7 g/dL (ref 30.0–36.0)
MCV: 82.1 fL (ref 80.0–100.0)
MCV: 83.5 fL (ref 80.0–100.0)
Monocytes Absolute: 0.3 10*3/uL (ref 0.1–1.0)
Monocytes Absolute: 0.9 10*3/uL (ref 0.1–1.0)
Monocytes Relative: 1 %
Monocytes Relative: 4 %
Neutro Abs: 22.8 10*3/uL — ABNORMAL HIGH (ref 1.7–7.7)
Neutro Abs: 17.6 10*3/uL — ABNORMAL HIGH (ref 1.7–7.7)
Neutrophils Relative %: 86 %
Neutrophils Relative %: 72 %
Platelets: 322 10*3/uL (ref 150–400)
Platelets: 501 10*3/uL — ABNORMAL HIGH (ref 150–400)
RBC: 3.46 MIL/uL — ABNORMAL LOW (ref 4.22–5.81)
RBC: 4.23 MIL/uL (ref 4.22–5.81)
RDW: 16.2 % — ABNORMAL HIGH (ref 11.5–15.5)
RDW: 16.5 % — ABNORMAL HIGH (ref 11.5–15.5)
WBC: 26.5 10*3/uL — ABNORMAL HIGH (ref 4.0–10.5)
WBC: 24.5 10*3/uL — ABNORMAL HIGH (ref 4.0–10.5)
nRBC: 0 % (ref 0.0–0.2)
nRBC: 0 /100 WBC
nRBC: 0 % (ref 0.0–0.2)

## 2022-07-23 LAB — BASIC METABOLIC PANEL
Anion gap: 9 (ref 5–15)
BUN: 18 mg/dL (ref 6–20)
CO2: 24 mmol/L (ref 22–32)
Calcium: 8.7 mg/dL — ABNORMAL LOW (ref 8.9–10.3)
Chloride: 107 mmol/L (ref 98–111)
Creatinine, Ser: 0.75 mg/dL (ref 0.61–1.24)
GFR, Estimated: >60 mL/min (ref >60)
Glucose, Bld: 140 mg/dL — ABNORMAL HIGH (ref 70–99)
Potassium: 4.1 mmol/L (ref 3.5–5.1)
Sodium: 140 mmol/L (ref 135–145)

## 2022-07-23 LAB — CBC
HCT: 29.5 % — ABNORMAL LOW (ref 39.0–52.0)
Hemoglobin: 9.8 g/dL — ABNORMAL LOW (ref 13.0–17.0)
MCH: 27.1 pg (ref 26.0–34.0)
MCHC: 33.2 g/dL (ref 30.0–36.0)
MCV: 81.5 fL (ref 80.0–100.0)
Platelets: 338 10*3/uL (ref 150–400)
RBC: 3.62 MIL/uL — ABNORMAL LOW (ref 4.22–5.81)
RDW: 16.1 % — ABNORMAL HIGH (ref 11.5–15.5)
WBC: 31.6 10*3/uL — ABNORMAL HIGH (ref 4.0–10.5)
nRBC: 0 % (ref 0.0–0.2)

## 2022-07-23 SURGERY — IRRIGATION AND DEBRIDEMENT EXTREMITY
Anesthesia: General | Site: Arm Upper | Laterality: Right

## 2022-07-23 MED ORDER — 0.9 % SODIUM CHLORIDE (POUR BTL) OPTIME
TOPICAL | Status: DC | PRN
  Administered 2022-07-23: 1000 mL

## 2022-07-23 MED ORDER — ONDANSETRON HCL 4 MG/2ML IJ SOLN
INTRAMUSCULAR | Status: DC | PRN
  Administered 2022-07-23: 4 mg via INTRAVENOUS

## 2022-07-23 MED ORDER — ONDANSETRON HCL 4 MG/2ML IJ SOLN
4.0000 mg | Freq: Four times a day (QID) | INTRAMUSCULAR | Status: DC | PRN
Start: 2022-07-23 — End: 2022-07-30

## 2022-07-23 MED ORDER — HYDROMORPHONE HCL 1 MG/ML IJ SOLN
0.5000 mg | INTRAMUSCULAR | Status: DC | PRN
  Administered 2022-07-23 – 2022-07-30 (×8): 1 mg via INTRAVENOUS
  Filled 2022-07-23 (×8): qty 1

## 2022-07-23 MED ORDER — FENTANYL CITRATE (PF) 250 MCG/5ML IJ SOLN
INTRAMUSCULAR | Status: DC | PRN
  Administered 2022-07-23 (×3): 50 ug via INTRAVENOUS
  Administered 2022-07-23: 100 ug via INTRAVENOUS

## 2022-07-23 MED ORDER — METHOCARBAMOL 500 MG PO TABS
500.0000 mg | ORAL_TABLET | Freq: Four times a day (QID) | ORAL | Status: DC | PRN
  Administered 2022-07-24 – 2022-07-26 (×3): 500 mg via ORAL
  Filled 2022-07-23 (×4): qty 1

## 2022-07-23 MED ORDER — METOCLOPRAMIDE HCL 5 MG/ML IJ SOLN: 5.0000 mg | Freq: Three times a day (TID) | INTRAMUSCULAR | Status: DC | PRN

## 2022-07-23 MED ORDER — BISACODYL 10 MG RE SUPP
10.0000 mg | Freq: Every day | RECTAL | Status: DC | PRN
Start: 2022-07-23 — End: 2022-07-30

## 2022-07-23 MED ORDER — HYDROMORPHONE HCL 1 MG/ML IJ SOLN
0.2500 mg | INTRAMUSCULAR | Status: DC | PRN
  Administered 2022-07-23 (×4): 0.5 mg via INTRAVENOUS

## 2022-07-23 MED ORDER — CHLORHEXIDINE GLUCONATE 0.12 % MT SOLN: 15.0000 mL | Freq: Once | OROMUCOSAL | Status: DC

## 2022-07-23 MED ORDER — LIDOCAINE 2% (20 MG/ML) 5 ML SYRINGE
INTRAMUSCULAR | Status: DC | PRN
  Administered 2022-07-23: 100 mg via INTRAVENOUS

## 2022-07-23 MED ORDER — POLYETHYLENE GLYCOL 3350 17 G PO PACK: 17.0000 g | PACK | Freq: Every day | ORAL | Status: DC | PRN

## 2022-07-23 MED ORDER — PROPOFOL 10 MG/ML IV BOLUS
INTRAVENOUS | Status: DC | PRN
  Administered 2022-07-23: 150 mg via INTRAVENOUS
  Administered 2022-07-23: 50 mg via INTRAVENOUS

## 2022-07-23 MED ORDER — LACTATED RINGERS IV SOLN: INTRAVENOUS | Status: DC

## 2022-07-23 MED ORDER — ORAL CARE MOUTH RINSE: 15.0000 mL | Freq: Once | OROMUCOSAL | Status: DC

## 2022-07-23 MED ORDER — MIDAZOLAM HCL 2 MG/2ML IJ SOLN
INTRAMUSCULAR | Status: DC | PRN
  Administered 2022-07-23: 2 mg via INTRAVENOUS

## 2022-07-23 MED ORDER — FENTANYL CITRATE (PF) 250 MCG/5ML IJ SOLN
INTRAMUSCULAR | Status: AC
  Filled 2022-07-23: qty 5

## 2022-07-23 MED ORDER — OXYCODONE HCL 5 MG PO TABS
5.0000 mg | ORAL_TABLET | ORAL | Status: DC | PRN
  Administered 2022-07-26 – 2022-07-28 (×2): 10 mg via ORAL
  Filled 2022-07-23: qty 2

## 2022-07-23 MED ORDER — HYDROMORPHONE HCL 1 MG/ML IJ SOLN
INTRAMUSCULAR | Status: AC
  Filled 2022-07-23: qty 0.5

## 2022-07-23 MED ORDER — MIDAZOLAM HCL 2 MG/2ML IJ SOLN
INTRAMUSCULAR | Status: AC
Start: 2022-07-23 — End: ?
  Filled 2022-07-23: qty 2

## 2022-07-23 MED ORDER — ACETAMINOPHEN 160 MG/5ML PO SOLN: 325.0000 mg | ORAL | Status: DC | PRN

## 2022-07-23 MED ORDER — ONDANSETRON HCL 4 MG/2ML IJ SOLN: 4.0000 mg | Freq: Once | INTRAMUSCULAR | Status: DC | PRN

## 2022-07-23 MED ORDER — OXYCODONE HCL 5 MG PO TABS: 5.0000 mg | ORAL_TABLET | Freq: Once | ORAL | Status: DC | PRN

## 2022-07-23 MED ORDER — ACETAMINOPHEN 325 MG PO TABS: 325.0000 mg | ORAL_TABLET | ORAL | Status: DC | PRN

## 2022-07-23 MED ORDER — KETOROLAC TROMETHAMINE 30 MG/ML IJ SOLN
INTRAMUSCULAR | Status: DC | PRN
  Administered 2022-07-23: 30 mg via INTRAVENOUS

## 2022-07-23 MED ORDER — HYDROMORPHONE HCL 1 MG/ML IJ SOLN
INTRAMUSCULAR | Status: AC
  Filled 2022-07-23: qty 2

## 2022-07-23 MED ORDER — METOCLOPRAMIDE HCL 5 MG PO TABS: 5.0000 mg | ORAL_TABLET | Freq: Three times a day (TID) | ORAL | Status: DC | PRN

## 2022-07-23 MED ORDER — FENTANYL CITRATE (PF) 100 MCG/2ML IJ SOLN
INTRAMUSCULAR | Status: AC
  Filled 2022-07-23: qty 4

## 2022-07-23 MED ORDER — MAGNESIUM CITRATE PO SOLN: 1.0000 | Freq: Once | ORAL | Status: DC | PRN

## 2022-07-23 MED ORDER — SODIUM CHLORIDE 0.9 % IV SOLN
INTRAVENOUS | Status: DC
Start: 2022-07-23 — End: 2022-08-02

## 2022-07-23 MED ORDER — METHOCARBAMOL 1000 MG/10ML IJ SOLN: 500.0000 mg | Freq: Four times a day (QID) | INTRAVENOUS | Status: DC | PRN

## 2022-07-23 MED ORDER — OXYCODONE HCL 5 MG PO TABS
10.0000 mg | ORAL_TABLET | ORAL | Status: DC | PRN
  Administered 2022-07-24 – 2022-08-01 (×30): 15 mg via ORAL
  Filled 2022-07-23 (×4): qty 3
  Filled 2022-07-23: qty 2
  Filled 2022-07-23 (×26): qty 3

## 2022-07-23 MED ORDER — PROPOFOL 10 MG/ML IV BOLUS
INTRAVENOUS | Status: AC
  Filled 2022-07-23: qty 20

## 2022-07-23 MED ORDER — ACETAMINOPHEN 325 MG PO TABS
325.0000 mg | ORAL_TABLET | Freq: Four times a day (QID) | ORAL | Status: DC | PRN
  Administered 2022-08-01: 650 mg via ORAL
  Filled 2022-07-23: qty 2

## 2022-07-23 MED ORDER — ONDANSETRON HCL 4 MG PO TABS: 4.0000 mg | ORAL_TABLET | Freq: Four times a day (QID) | ORAL | Status: DC | PRN

## 2022-07-23 MED ORDER — DOCUSATE SODIUM 100 MG PO CAPS
100.0000 mg | ORAL_CAPSULE | Freq: Two times a day (BID) | ORAL | Status: DC
  Administered 2022-07-23 – 2022-07-27 (×7): 100 mg via ORAL
  Filled 2022-07-23 (×10): qty 1

## 2022-07-23 MED ORDER — CHLORHEXIDINE GLUCONATE 0.12 % MT SOLN
OROMUCOSAL | Status: AC
  Administered 2022-07-23: 15 mL
  Filled 2022-07-23: qty 15

## 2022-07-23 MED ORDER — OXYCODONE HCL 5 MG/5ML PO SOLN: 5.0000 mg | Freq: Once | ORAL | Status: DC | PRN

## 2022-07-23 MED ORDER — HYDROMORPHONE HCL 1 MG/ML IJ SOLN
INTRAMUSCULAR | Status: DC | PRN
  Administered 2022-07-23 (×2): .5 mg via INTRAVENOUS

## 2022-07-23 MED ORDER — SIMETHICONE 80 MG PO CHEW
80.0000 mg | CHEWABLE_TABLET | Freq: Four times a day (QID) | ORAL | Status: DC | PRN
  Filled 2022-07-23: qty 1

## 2022-07-23 MED ORDER — FENTANYL CITRATE (PF) 100 MCG/2ML IJ SOLN
25.0000 ug | INTRAMUSCULAR | Status: DC | PRN
  Administered 2022-07-23 (×3): 50 ug via INTRAVENOUS

## 2022-07-23 MED ORDER — MEPERIDINE HCL 25 MG/ML IJ SOLN: 6.2500 mg | INTRAMUSCULAR | Status: DC | PRN

## 2022-07-23 SURGICAL SUPPLY — 38 items
BAG COUNTER SPONGE SURGICOUNT (BAG) IMPLANT
BLADE SURG 21 STRL SS (BLADE) ×2 IMPLANT
BNDG COHESIVE 4X5 TAN STRL (GAUZE/BANDAGES/DRESSINGS) IMPLANT
BNDG COHESIVE 6X5 TAN ST LF (GAUZE/BANDAGES/DRESSINGS) IMPLANT
BNDG COHESIVE 6X5 TAN STRL LF (GAUZE/BANDAGES/DRESSINGS) IMPLANT
BNDG GAUZE ELAST 4 BULKY (GAUZE/BANDAGES/DRESSINGS) ×4 IMPLANT
CNTNR URN SCR LID CUP LEK RST (MISCELLANEOUS) IMPLANT
CONT SPEC 4OZ STRL OR WHT (MISCELLANEOUS) ×2
COVER SURGICAL LIGHT HANDLE (MISCELLANEOUS) ×4 IMPLANT
DRAPE U-SHAPE 47X51 STRL (DRAPES) ×2 IMPLANT
DRESSING VERAFLO CLEANS CC MED (GAUZE/BANDAGES/DRESSINGS) IMPLANT
DRSG VERAFLO CLEANSE CC MED (GAUZE/BANDAGES/DRESSINGS) ×4
DURAPREP 26ML APPLICATOR (WOUND CARE) ×2 IMPLANT
ELECT REM PT RETURN 9FT ADLT (ELECTROSURGICAL)
ELECTRODE REM PT RTRN 9FT ADLT (ELECTROSURGICAL) IMPLANT
GAUZE SPONGE 4X4 12PLY STRL (GAUZE/BANDAGES/DRESSINGS) ×2 IMPLANT
GLOVE BIOGEL PI IND STRL 9 (GLOVE) ×2 IMPLANT
GLOVE BIOGEL PI INDICATOR 9 (GLOVE) ×2
GLOVE SURG ORTHO 9.0 STRL STRW (GLOVE) ×2 IMPLANT
GOWN STRL REUS W/ TWL XL LVL3 (GOWN DISPOSABLE) ×4 IMPLANT
GOWN STRL REUS W/TWL XL LVL3 (GOWN DISPOSABLE) ×4
HANDPIECE INTERPULSE COAX TIP (DISPOSABLE)
KIT BASIN OR (CUSTOM PROCEDURE TRAY) ×2 IMPLANT
KIT TURNOVER KIT B (KITS) ×2 IMPLANT
MANIFOLD NEPTUNE II (INSTRUMENTS) ×2 IMPLANT
NS IRRIG 1000ML POUR BTL (IV SOLUTION) ×2 IMPLANT
PACK ORTHO EXTREMITY (CUSTOM PROCEDURE TRAY) ×2 IMPLANT
PAD ARMBOARD 7.5X6 YLW CONV (MISCELLANEOUS) ×4 IMPLANT
PAD NEG PRESSURE SENSATRAC (MISCELLANEOUS) IMPLANT
SET HNDPC FAN SPRY TIP SCT (DISPOSABLE) IMPLANT
STOCKINETTE IMPERVIOUS 9X36 MD (GAUZE/BANDAGES/DRESSINGS) IMPLANT
SUT SILK 2 0 (SUTURE) ×2
SUT SILK 2-0 18XBRD TIE 12 (SUTURE) IMPLANT
SWAB COLLECTION DEVICE MRSA (MISCELLANEOUS) ×2 IMPLANT
SWAB CULTURE ESWAB REG 1ML (MISCELLANEOUS) IMPLANT
TOWEL GREEN STERILE (TOWEL DISPOSABLE) ×2 IMPLANT
TUBE CONNECTING 12X1/4 (SUCTIONS) ×2 IMPLANT
YANKAUER SUCT BULB TIP NO VENT (SUCTIONS) ×2 IMPLANT

## 2022-07-23 NOTE — Consult Note (Signed)
ORTHOPAEDIC CONSULTATION  REQUESTING PHYSICIAN: Maretta Bees, MD  Chief Complaint: Abscess right upper extremity.  HPI: James Baker is a 46 y.o. male who presents with abscess right upper extremity status post irrigation and debridement with Dr. Frazier Butt on 8/21.  Patient has increasing white blood cell count and is evaluated today for further surgical debridement.  Past Medical History:  Diagnosis Date   Asthma    Past Surgical History:  Procedure Laterality Date   I & D EXTREMITY Right 07/21/2022   Procedure: IRRIGATION AND DEBRIDEMENT EXTREMITY;  Surgeon: Marlyne Beards, MD;  Location: [facility] OR;  Service: Orthopedics;  Laterality: Right;   Social History   Socioeconomic History   Marital status: Single    Spouse name: Not on file   Number of children: Not on file   Years of education: Not on file   Highest education level: Not on file  Occupational History   Not on file  Tobacco Use   Smoking status: Every Day    Packs/day: 1.00    Years: 15.00    Total pack years: 15.00    Types: Cigarettes   Smokeless tobacco: Never  Vaping Use   Vaping Use: Never used  Substance and Sexual Activity   Alcohol use: Yes    Alcohol/week: 3.0 standard drinks of alcohol    Types: 3 Cans of beer per week    Comment: occasionally   Drug use: No   Sexual activity: Never  Other Topics Concern   Not on file  Social History Narrative   Not on file   Social Determinants of Health   Financial Resource Strain: Not on file  Food Insecurity: Not on file  Transportation Needs: Not on file  Physical Activity: Not on file  Stress: Not on file  Social Connections: Not on file   Family History  Problem Relation Age of Onset   Cancer Mother    Stroke Father    Seizures Father    Cancer Other    - negative except otherwise stated in the family history section Allergies  Allergen Reactions   Hydrocodone Itching   Prior to Admission medications   Medication Sig Start  Date End Date Taking? Authorizing Provider  acetaminophen (TYLENOL) 500 MG tablet Take 1,000 mg by mouth every 6 (six) hours as needed for moderate pain.   Yes [provider]   No results found. - pertinent xrays, CT, MRI studies were reviewed and independently interpreted  Positive ROS: All other systems have been reviewed and were otherwise negative with the exception of those mentioned in the HPI and as above.  Physical Exam: General: Alert, no acute distress Psychiatric: Patient is competent for consent with normal mood and affect Lymphatic: No axillary or cervical lymphadenopathy Cardiovascular: No pedal edema Respiratory: No cyanosis, no use of accessory musculature GI: No organomegaly, abdomen is soft and non-tender    Images:  @ENCIMAGES @  Labs:  Lab Results  Component Value Date   REPTSTATUS PENDING 07/21/2022   GRAMSTAIN  07/21/2022    MODERATE WBC PRESENT,BOTH PMN AND MONONUCLEAR MODERATE GRAM NEGATIVE RODS MODERATE GRAM POSITIVE COCCI IN PAIRS AND CHAINS FEW GRAM POSITIVE RODS    CULT  07/21/2022    CULTURE REINCUBATED FOR BETTER GROWTH Performed at [facility], [address], [place], [place]    LABORGA METHICILLIN RESISTANT STAPHYLOCOCCUS AUREUS 07/22/2009    Lab Results  Component Value Date   ALBUMIN 3.2 (L) 07/20/2022  Latest Ref Rng & Units 07/23/2022    3:48 AM 07/22/2022    5:44 AM 07/21/2022    5:33 AM  CBC EXTENDED  WBC 4.0 - 10.5 K/uL 31.6  29.2  20.2   RBC 4.22 - 5.81 MIL/uL 3.62  3.88  4.37   Hemoglobin 13.0 - 17.0 g/dL 9.8  16.1  09.6   HCT 04.5 - 52.0 % 29.5  32.7  36.6   Platelets 150 - 400 K/uL 338  320  285     Neurologic: Patient does not have protective sensation bilateral lower extremities.   MUSCULOSKELETAL:   Skin: Examination patient has a wound VAC that is intact.  Patient's wound is covered with a wound VAC sponge there is no surrounding cellulitis.  Patient's right upper extremity  has good motor function in the hand wrist and elbow.  White blood cell count is increased to 31.6.  Hemoglobin 9.8.  Assessment: Assessment: Abscess right upper extremity.  Plan: Plan: We will plan for repeat debridement today and Friday.  Plan for application of an installation wound VAC.  Thank you for the consult and the opportunity to see Mr. James Winterton, MD [facility] [phone] 7:26 AM

## 2022-07-23 NOTE — Anesthesia Procedure Notes (Signed)
Procedure Name: LMA Insertion Date/Time: 07/23/2022 5:30 PM  Performed by: Maxine Glenn, CRNAPre-anesthesia Checklist: Patient identified, Emergency Drugs available, Suction available and Patient being monitored Patient Re-evaluated:Patient Re-evaluated prior to induction Oxygen Delivery Method: Circle System Utilized Preoxygenation: Pre-oxygenation with 100% oxygen Induction Type: IV induction Ventilation: Mask ventilation without difficulty LMA: LMA inserted LMA Size: 4.0 Number of attempts: 1 Airway Equipment and Method: Bite block Placement Confirmation: positive ETCO2 Tube secured with: Tape Dental Injury: Teeth and Oropharynx as per pre-operative assessment

## 2022-07-23 NOTE — Op Note (Signed)
Date of Surgery: 07/23/2022  INDICATIONS: Patient is a 46 y.o.-year-old male with a severe right upper extremity infection.  Patient developed swelling, erythema, and pain in the right upper extremity from around the shoulder to the [facility] fossa on Thursday.  He presented to the ER at [facility] on Sunday and was transferred to [facility].  CT scan demonstrated soft tissue abscess with subcutaneous gas concerning for necrotizing or gas-forming infection.  He was admitted to the medicine service for IV antibiotics.  I was contacted about the patient this morning.  On my exam this morning, the patient was very uncomfortable and in severe pain.  He was tearful and unable to get comfortable in any position.  He was found to have a severely swollen erythematous right arm from around the shoulder into the [facility] fossa.  He had some erythema that was spreading across the anterior chest.  He had a palpable abscess at the mid aspect of the upper arm.  He was immediately posted for emergent surgery.  Risks, benefits, and alternatives to surgery were again discussed with the patient in the preoperative area. The patient wishes to proceed with surgery.  Informed consent was signed after our discussion.   PREOPERATIVE DIAGNOSIS:  Right upper extremity abscess Necrosis of right deltoid, pectoralis, biceps muscle  POSTOPERATIVE DIAGNOSIS: Same.  PROCEDURE:  Irrigation and excisional debridement of right upper extremity, 30x10 cm 2.  Application of wound vac  SURGEON: Audria Nine, M.D.  ASSIST:   ANESTHESIA:  general  IV FLUIDS AND URINE: See anesthesia.  ESTIMATED BLOOD LOSS: 30 mL.  IMPLANTS: * No implants in log *   DRAINS: [place]  COMPLICATIONS: None  DESCRIPTION OF PROCEDURE: The patient was met in the preoperative holding area where the surgical site was marked and the consent form was verified.  The patient was then taken to the operating room and transferred to the operating table.  All bony  prominences were well padded.  General endotracheal anesthesia was induced.  The operative extremity was prepped and draped in the usual and sterile fashion.  A formal time-out was performed to confirm that this was the correct patient, surgery, side, and site.   A longitudinal incision was made from around the level of the glenohumeral joint to around the [facility] fossa.  The skin was incised.  Around the midportion of the incision, there was immediate rush of copious, very foul-smelling purulence.  Multiple specimens were taken for culture.  Bovie cautery was used to incise the subcutaneous tissue.  Blunt dissection was used to identify the cephalic vein which was protected.  The fascia overlying the biceps muscle as well as the deltoid and pectoralis was incised.  There was some necrotic and nonviable appearing tissue of the anterior deltoid.  This tissue was sharply debrided using scissors and Bovie electrocautery.  There was no tracking of any fluid along fascial planes and the fascia was normal in appearance.  There was some muscle fibers of the anterior aspect of the bicep as well as the anterior deltoid and pectoralis muscle that was not contractile with Bovie stimulation.  This muscle was sharply debrided.  It appeared to be nonviable.  The majority of the biceps, deltoid, and pectoralis muscle was healthy appearing with left behind.  There is some subcutaneous necrosis at the anterior skin flap in the proximal one third of the wound.  This nonviable necrotic appearing tissue was sharply debrided using a scissor and a rongeur.  Following thorough debridement, the wound  was thoroughly irrigated with 6L of normal saline via low flow cystoscopy tubing.  A wound VAC sponge was trimmed and applied to the wound using the wound VAC adhesive tape.  The VAC was attached to the control unit and a good seal was achieved.  The arm was then wrapped with an Ace wrap.    The patient was reversed from anesthesia and  extubated uneventfully.  He was transferred the postoperative bed.  Taken back to the PACU in stable condition.  POSTOPERATIVE PLAN: Patient remains admitted to the medicine service for IV antibiotics.  We will follow-up cultures.  We will plan for repeat debridement on Wednesday.  Audria Nine, MD 7:23 PM

## 2022-07-23 NOTE — Transfer of Care (Signed)
Immediate Anesthesia Transfer of Care Note  Patient: James Baker  Procedure(s) Performed: IRRIGATION AND DEBRIDEMENT ARM (Right) APPLICATION OF WOUND VAC (Right: Arm Upper)  Patient Location: PACU  Anesthesia Type:General  Level of Consciousness: awake, alert  and oriented  Airway & Oxygen Therapy: Patient Spontanous Breathing  Post-op Assessment: Report given to RN and Post -op Vital signs reviewed and stable  Post vital signs: Reviewed and stable  Last Vitals:  Vitals Value Taken Time  BP 159/107 07/23/22 1816  Temp    Pulse 89 07/23/22 1818  Resp 10 07/23/22 1818  SpO2 98 % 07/23/22 1818  Vitals shown include unvalidated device data.  Last Pain:  Vitals:   07/23/22 1500  TempSrc: Oral  PainSc: 7       Patients Stated Pain Goal: 0 (07/23/22 1500)  Complications: No notable events documented.

## 2022-07-23 NOTE — Anesthesia Postprocedure Evaluation (Signed)
Anesthesia Post Note  Patient: James Baker  Procedure(s) Performed: IRRIGATION AND DEBRIDEMENT ARM (Right) APPLICATION OF WOUND VAC (Right: Arm Upper)     Patient location during evaluation: PACU Anesthesia Type: General Level of consciousness: awake and alert Pain management: pain level controlled Vital Signs Assessment: post-procedure vital signs reviewed and stable Respiratory status: spontaneous breathing, nonlabored ventilation, respiratory function stable and patient connected to nasal cannula oxygen Cardiovascular status: blood pressure returned to baseline and stable Postop Assessment: no apparent nausea or vomiting Anesthetic complications: no   No notable events documented.  Last Vitals:  Vitals:   07/23/22 1900 07/23/22 1915  BP: (!) 162/95 (!) 163/94  Pulse: 69 62  Resp: 12 15  Temp:    SpO2: 100% 98%    Last Pain:  Vitals:   07/23/22 1915  TempSrc:   PainSc: 5                  Skylur Fuston

## 2022-07-23 NOTE — Anesthesia Preprocedure Evaluation (Addendum)
Anesthesia Evaluation  Patient identified by MRN, date of birth, ID band Patient awake    Reviewed: Allergy & Precautions, NPO status , Patient's Chart, lab work & pertinent test results  History of Anesthesia Complications Negative for: history of anesthetic complications  Airway Mallampati: I  TM Distance: >3 FB Neck ROM: Full    Dental  (+) Dental Advisory Given, Loose, Poor Dentition, Missing,    Pulmonary asthma , Current Smoker and Patient abstained from smoking.,    Pulmonary exam normal        Cardiovascular negative cardio ROS   Rhythm:Regular Rate:Tachycardia     Neuro/Psych negative neurological ROS  negative psych ROS   GI/Hepatic negative GI ROS, (+)     substance abuse  marijuana use,   Endo/Other  negative endocrine ROS  Renal/GU negative Renal ROS     Musculoskeletal negative musculoskeletal ROS (+)   Abdominal   Peds  Hematology  (+) Blood dyscrasia, anemia ,   Anesthesia Other Findings   Reproductive/Obstetrics                            Anesthesia Physical  Anesthesia Plan  ASA: 2  Anesthesia Plan: General   Post-op Pain Management: Tylenol PO (pre-op)* and Celebrex PO (pre-op)*   Induction: Intravenous  PONV Risk Score and Plan: 1 and Treatment may vary due to age or medical condition, Ondansetron and Dexamethasone  Airway Management Planned: Oral ETT and LMA  Additional Equipment: None  Intra-op Plan:   Post-operative Plan: Extubation in OR  Informed Consent: I have reviewed the patients History and Physical, chart, labs and discussed the procedure including the risks, benefits and alternatives for the proposed anesthesia with the patient or authorized representative who has indicated his/her understanding and acceptance.     Dental advisory given  Plan Discussed with: CRNA and Anesthesiologist  Anesthesia Plan Comments:          Anesthesia Quick Evaluation

## 2022-07-23 NOTE — Progress Notes (Signed)
Spoke with Pharmacist Clydie Braun in [facility], says it is ok to give dilaudid.

## 2022-07-23 NOTE — Op Note (Signed)
07/23/2022  6:12 PM  PATIENT:  James Baker    PRE-OPERATIVE DIAGNOSIS: Necrotizing fasciitis right proximal arm  POST-OPERATIVE DIAGNOSIS:  Same  PROCEDURE:  IRRIGATION AND DEBRIDEMENT ARM, APPLICATION OF INSTILLATION WOUND VAC  Fascia tissue sent for cultures.  SURGEON:  Nadara Mustard, MD  PHYSICIAN ASSISTANT:None ANESTHESIA:   General  PREOPERATIVE INDICATIONS:  James Baker is a  46 y.o. male with a diagnosis of Right Arm Cellulitis who failed conservative measures and elected for surgical management.    The risks benefits and alternatives were discussed with the patient preoperatively including but not limited to the risks of infection, bleeding, nerve injury, cardiopulmonary complications, the need for revision surgery, among others, and the patient was willing to proceed.  OPERATIVE IMPLANTS: Cleanse choice sponges x3.  @ENCIMAGES @  OPERATIVE FINDINGS: There was thickened involved fascia that was excised and sent for cultures.  The muscle was debrided and remaining muscle had good contractility and color.  OPERATIVE PROCEDURE: Patient was brought the operating room and underwent a general anesthetic.  After adequate levels anesthesia obtained patient's right upper extremity was prepped using DuraPrep draped into a sterile field a timeout was called.  Patient had a necrotic vein that was suture-ligated with 2-0 silk and excised.  A rondure was used to resect the superficial muscle layer.  Blunt dissection was carried proximally medially laterally and inferiorly there is no further abscess collections.  Further fascia was excised.  The wound was irrigated with normal saline.  3 of the cleanse choice wound VAC sponges was placed deep within the wound this was covered with derma tack Ioban and Coban.  This had a good suction fit patient was extubated taken the PACU in stable condition.   DISCHARGE PLANNING:  Antibiotic duration: Continue IV antibiotics  Weightbearing: Not  applicable  Pain medication: Opioid pathway  Dressing care/ Wound VAC: Continue wound VAC  Ambulatory devices: Not applicable  Plan for return to the operating room on Friday with anticipated wound closure and application of Kerecis tissue graft.

## 2022-07-23 NOTE — Progress Notes (Signed)
PROGRESS NOTE        PATIENT DETAILS Name: James Baker Age: 46 y.o. Sex: male Date of Birth: August 21, 1976 Admit Date: 07/20/2022 Admitting Physician Onnie Boer, MD HYQ:MVHQION, No Pcp Per  Brief Summary: Patient is a 46 y.o.  male with history of tobacco, occasional cocaine/marijuana use-who gave himself a tattoo in the right forearm-presented with yellow/greenish discharge surrounding the tattoo area-subsequent pain and swelling of his right arm.  He was found to have necrotizing tissue infection with abscess formation-and subsequently admitted to the [facility] service.  See below for further details.   Significant events: 8/20>> admit to [facility] at [facility]-right arm necrotizing infection/abscess formation. 8/21>> transfer to [facility] irrigation and debridement by orthopedics. 8/22>> transfer to [facility]  Significant studies: 8/20>> CT right humerus: Large enhancing abscess with gas-involving deltoid/biceps muscles.  Significant microbiology data: 8/20>> COVID/influenza PCR: Negative 8/20>> blood culture: No growth 8/21>> intraoperative cultures: Pending  Procedures: 8/21>> irrigation/debridement-right arm-by orthopedics at [facility].  Consults: Orthopedics.  Subjective: Anxious-worried that he may lose his arm.  Tearful.   Objective: Vitals: Blood pressure 112/74, pulse 63, temperature (!) 97.5 F (36.4 C), temperature source Oral, resp. rate 16, height 5\' 10"  (1.778 m), weight 61.2 kg, SpO2 98 %.   Exam: Gen Exam:Alert awake-not in any distress HEENT:atraumatic, normocephalic Chest: B/L clear to auscultation anteriorly CVS:S1S2 regular Abdomen:soft non tender, non distended Extremities:no edema Neurology: Non focal Skin: no rash  Pertinent Labs/Radiology:    Latest Ref Rng & Units 07/23/2022    3:48 AM 07/22/2022    5:44 AM 07/21/2022    5:33 AM  CBC  WBC 4.0 - 10.5 K/uL 31.6  29.2  20.2   Hemoglobin 13.0 - 17.0 g/dL 9.8  07/23/2022  62.9    Hematocrit 39.0 - 52.0 % 29.5  32.7  36.6   Platelets 150 - 400 K/uL 338  320  285     Lab Results  Component Value Date   NA 140 07/23/2022   K 4.1 07/23/2022   CL 107 07/23/2022   CO2 24 07/23/2022      Assessment/Plan: Severe sepsis with necrotizing soft tissue infection involving right arm with abscess formation: Continues to have significant leukocytosis-cultures pending-on empiric Zyvox/Zosyn-Dr. 07/25/2022 now following with plans for irrigation and debridement today and on Friday.  Tobacco/cocaine/marijuana use: Counseled-denies IVDA.  BMI: Estimated body mass index is 19.37 kg/m as calculated from the following:   Height as of this encounter: 5\' 10"  (1.778 m).   Weight as of this encounter: 61.2 kg.   Code status:   Code Status: Full Code   DVT Prophylaxis: SCDs Start: 07/21/22 0100   Family Communication: Significant other at bedside   Disposition Plan: Status is: Inpatient Remains inpatient appropriate because: Necrotizing infection involving right arm-will likely remain inpatient for several more days-will require several more irrigation/debridement.  On empiric IV antibiotics.   Planned Discharge Destination:Home   Diet: Diet Order             Diet NPO time specified  Diet effective ____                     Antimicrobial agents: Anti-infectives (From admission, onward)    Start     Dose/Rate Route Frequency Ordered Stop   07/23/22 1430  ceFAZolin (ANCEF) IVPB 2g/100 mL premix  2 g 200 mL/hr over 30 Minutes Intravenous On call to O.R. 07/22/22 1910 07/24/22 0559   07/22/22 2200  linezolid (ZYVOX) IVPB 600 mg        600 mg 300 mL/hr over 60 Minutes Intravenous Every 12 hours 07/22/22 1354     07/22/22 1445  piperacillin-tazobactam (ZOSYN) IVPB 3.375 g        3.375 g 12.5 mL/hr over 240 Minutes Intravenous Every 8 hours 07/22/22 1353     07/21/22 2200  vancomycin (VANCOREADY) IVPB 750 mg/150 mL  Status:  Discontinued       See Hyperspace  for full Linked Orders Report.   750 mg 150 mL/hr over 60 Minutes Intravenous Every 12 hours 07/21/22 1457 07/22/22 1354   07/21/22 1458  cefTRIAXone (ROCEPHIN) 2 g in sodium chloride 0.9 % 100 mL IVPB  Status:  Discontinued        2 g 200 mL/hr over 30 Minutes Intravenous Daily 07/21/22 1458 07/22/22 0742   07/21/22 0615  vancomycin (VANCOREADY) IVPB 750 mg/150 mL  Status:  Discontinued       See Hyperspace for full Linked Orders Report.   750 mg 150 mL/hr over 60 Minutes Intravenous Every 12 hours 07/20/22 1805 07/21/22 1457   07/20/22 1815  vancomycin (VANCOREADY) IVPB 1250 mg/250 mL       See Hyperspace for full Linked Orders Report.   1,250 mg 166.7 mL/hr over 90 Minutes Intravenous  Once 07/20/22 1805 07/20/22 2015   07/20/22 1800  metroNIDAZOLE (FLAGYL) IVPB 500 mg  Status:  Discontinued        500 mg 100 mL/hr over 60 Minutes Intravenous Every 12 hours 07/20/22 1749 07/22/22 1353   07/20/22 1615  cefTRIAXone (ROCEPHIN) 2 g in sodium chloride 0.9 % 100 mL IVPB  Status:  Discontinued        2 g 200 mL/hr over 30 Minutes Intravenous Every 24 hours 07/20/22 1613 07/21/22 1458        MEDICATIONS: Scheduled Meds:  acetaminophen  1,000 mg Oral TID   albuterol  2.5 mg Nebulization Once   chlorhexidine  60 mL Topical Once   nicotine  14 mg Transdermal Daily   povidone-iodine  2 Application Topical Once   Continuous Infusions:   ceFAZolin (ANCEF) IV     linezolid (ZYVOX) IV 600 mg (07/22/22 2102)   piperacillin-tazobactam (ZOSYN)  IV 3.375 g (07/23/22 0535)   PRN Meds:.albuterol, morphine injection, ondansetron **OR** ondansetron (ZOFRAN) IV, oxyCODONE, polyethylene glycol, simethicone   I have personally reviewed following labs and imaging studies  LABORATORY DATA: CBC: Recent Labs  Lab 07/20/22 1633 07/21/22 0533 07/22/22 0544 07/23/22 0348  WBC 22.1* 20.2* 29.2* 31.6*  NEUTROABS 15.7*  --   --   --   HGB 13.4 11.8* 10.4* 9.8*  HCT 41.2 36.6* 32.7* 29.5*  MCV  82.6 83.8 84.3 81.5  PLT 346 285 320 338    Basic Metabolic Panel: Recent Labs  Lab 07/20/22 1633 07/21/22 0533 07/22/22 0544 07/23/22 0348  NA 130* 138 141 140  K 3.8 3.6 3.9 4.1  CL 91* 107 111 107  CO2 30 25 25 24   GLUCOSE 122* 111* 159* 140*  BUN 13 14 16 18   CREATININE 0.97 0.96 0.73 0.75  CALCIUM 9.1 8.3* 8.8* 8.7*    GFR: Estimated Creatinine Clearance: 102 mL/min (by C-G formula based on SCr of 0.75 mg/dL).  Liver Function Tests: Recent Labs  Lab 07/20/22 1633  AST 26  ALT 13  ALKPHOS 64  BILITOT  1.1  PROT 7.2  ALBUMIN 3.2*   No results for input(s): "LIPASE", "AMYLASE" in the last 168 hours. No results for input(s): "AMMONIA" in the last 168 hours.  Coagulation Profile: Recent Labs  Lab 07/20/22 1633  INR 1.2    Cardiac Enzymes: No results for input(s): "CKTOTAL", "CKMB", "CKMBINDEX", "TROPONINI" in the last 168 hours.  BNP (last 3 results) No results for input(s): "PROBNP" in the last 8760 hours.  Lipid Profile: No results for input(s): "CHOL", "HDL", "LDLCALC", "TRIG", "CHOLHDL", "LDLDIRECT" in the last 72 hours.  Thyroid Function Tests: No results for input(s): "TSH", "T4TOTAL", "FREET4", "T3FREE", "THYROIDAB" in the last 72 hours.  Anemia Panel: No results for input(s): "VITAMINB12", "FOLATE", "FERRITIN", "TIBC", "IRON", "RETICCTPCT" in the last 72 hours.  Urine analysis:    Component Value Date/Time   COLORURINE YELLOW 04/06/2011 0223   APPEARANCEUR CLEAR 04/06/2011 0223   LABSPEC <1.005 (L) 04/06/2011 0223   PHURINE 5.5 04/06/2011 0223   GLUCOSEU NEGATIVE 04/06/2011 0223   HGBUR NEGATIVE 04/06/2011 0223   BILIRUBINUR NEGATIVE 04/06/2011 0223   KETONESUR NEGATIVE 04/06/2011 0223   PROTEINUR NEGATIVE 04/06/2011 0223   UROBILINOGEN 0.2 04/06/2011 0223   NITRITE NEGATIVE 04/06/2011 0223   LEUKOCYTESUR  04/06/2011 0223    NEGATIVE MICROSCOPIC NOT DONE ON URINES WITH NEGATIVE PROTEIN, BLOOD, LEUKOCYTES, NITRITE, OR GLUCOSE <1000  mg/dL.    Sepsis Labs: Lactic Acid, Venous    Component Value Date/Time   LATICACIDVEN 1.1 07/20/2022 1856    MICROBIOLOGY: Recent Results (from the past 240 hour(s))  Resp Panel by RT-PCR (Flu A&B, Covid) Anterior Nasal Swab     Status: None   Collection Time: 07/20/22  4:13 PM   Specimen: Anterior Nasal Swab  Result Value Ref Range Status   SARS Coronavirus 2 by RT PCR NEGATIVE NEGATIVE Final    Comment: (NOTE) SARS-CoV-2 target nucleic acids are NOT DETECTED.  The SARS-CoV-2 RNA is generally detectable in upper respiratory specimens during the acute phase of infection. The lowest concentration of SARS-CoV-2 viral copies this assay can detect is 138 copies/mL. A negative result does not preclude SARS-Cov-2 infection and should not be used as the sole basis for treatment or other patient management decisions. A negative result may occur with  improper specimen collection/handling, submission of specimen other than nasopharyngeal swab, presence of viral mutation(s) within the areas targeted by this assay, and inadequate number of viral copies(<138 copies/mL). A negative result must be combined with clinical observations, patient history, and epidemiological information. The expected result is Negative.  Fact Sheet for Patients:  BloggerCourse.com  Fact Sheet for Healthcare Providers:  SeriousBroker.it  This test is no t yet approved or cleared by the [place] FDA and  has been authorized for detection and/or diagnosis of SARS-CoV-2 by FDA under an Emergency Use Authorization (EUA). This EUA will remain  in effect (meaning this test can be used) for the duration of the COVID-19 declaration under Section 564(b)(1) of the Act, 21 U.S.C.section 360bbb-3(b)(1), unless the authorization is terminated  or revoked sooner.       Influenza A by PCR NEGATIVE NEGATIVE Final   Influenza B by PCR NEGATIVE NEGATIVE Final     Comment: (NOTE) The Xpert Xpress SARS-CoV-2/FLU/RSV plus assay is intended as an aid in the diagnosis of influenza from Nasopharyngeal swab specimens and should not be used as a sole basis for treatment. Nasal washings and aspirates are unacceptable for Xpert Xpress SARS-CoV-2/FLU/RSV testing.  Fact Sheet for Patients: BloggerCourse.com  Fact Sheet for Healthcare Providers:  SeriousBroker.ithttps://www.fda.gov/media/152162/download  This test is not yet approved or cleared by the [place] FDA and has been authorized for detection and/or diagnosis of SARS-CoV-2 by FDA under an Emergency Use Authorization (EUA). This EUA will remain in effect (meaning this test can be used) for the duration of the COVID-19 declaration under Section 564(b)(1) of the Act, 21 U.S.C. section 360bbb-3(b)(1), unless the authorization is terminated or revoked.  Performed at [facility], [address] [place], [place] [address]   Blood Culture (routine x 2)     Status: None (Preliminary result)   Collection Time: 07/20/22  4:18 PM   Specimen: BLOOD LEFT FOREARM  Result Value Ref Range Status   Specimen Description   Final    BLOOD LEFT FOREARM BOTTLES DRAWN AEROBIC AND ANAEROBIC   Special Requests Blood Culture adequate volume  Final   Culture   Final    NO GROWTH 3 DAYS Performed at [facility], [address] [place], [place] [address]    Report Status PENDING  Incomplete  Blood Culture (routine x 2)     Status: None (Preliminary result)   Collection Time: 07/20/22  4:33 PM   Specimen: Left Antecubital; Blood  Result Value Ref Range Status   Specimen Description   Final    LEFT ANTECUBITAL BOTTLES DRAWN AEROBIC AND ANAEROBIC   Special Requests Blood Culture adequate volume  Final   Culture   Final    NO GROWTH 3 DAYS Performed at [facility], [address] [place], [place] [address]    Report Status PENDING  Incomplete  MRSA Next Gen by PCR, Nasal     Status: None   Collection  Time: 07/21/22 12:54 AM   Specimen: Nasal Mucosa; Nasal Swab  Result Value Ref Range Status   MRSA by PCR Next Gen NOT DETECTED NOT DETECTED Final    Comment: (NOTE) The GeneXpert MRSA Assay (FDA approved for NASAL specimens only), is one component of a comprehensive MRSA colonization surveillance program. It is not intended to diagnose MRSA infection nor to guide or monitor treatment for MRSA infections. Test performance is not FDA approved in patients less than 44 years old. Performed at [facility], [address] [place], [place] [address]   Aerobic/Anaerobic Culture w Gram Stain (surgical/deep wound)     Status: None (Preliminary result)   Collection Time: 07/21/22 10:23 AM   Specimen: PATH Other; Tissue  Result Value Ref Range Status   Specimen Description ABSCESS  Final   Special Requests  RIGHT UPPER ARM  Final   Gram Stain   Final    MODERATE WBC PRESENT,BOTH PMN AND MONONUCLEAR MODERATE GRAM NEGATIVE RODS MODERATE GRAM POSITIVE COCCI IN PAIRS AND CHAINS FEW GRAM POSITIVE RODS    Culture   Final    CULTURE REINCUBATED FOR BETTER GROWTH Performed at [facility], [address] [place], [place] [address]    Report Status PENDING  Incomplete  Acid Fast Smear (AFB)     Status: None   Collection Time: 07/21/22 10:23 AM   Specimen: PATH Other; Tissue  Result Value Ref Range Status   AFB Specimen Processing Concentration  Final   Acid Fast Smear Negative  Final    Comment: (NOTE) Performed At: [facility] [address] [place], [place] [id number] Jolene SchimkeNagendra Sanjai MD ON:[phone]    Source (AFB) ABSCESS  Final    Comment: Performed at [facility], [address] [place], [place] [address]    RADIOLOGY STUDIES/RESULTS:  No results found.   LOS: 3 days   Jeoffrey Massed, MD  [facility]    To contact the attending provider between 7A-7P or the covering provider during after hours 7P-7A, please log into the web  site [email or web address] and access using universal [place] password for that web site. If you do not have the password, please call the hospital operator.  07/23/2022, 9:03 AM

## 2022-07-24 ENCOUNTER — Encounter (HOSPITAL_COMMUNITY): Payer: Self-pay | Admitting: Orthopedic Surgery

## 2022-07-24 DIAGNOSIS — F17210 Nicotine dependence, cigarettes, uncomplicated: Secondary | ICD-10-CM

## 2022-07-24 DIAGNOSIS — R6520 Severe sepsis without septic shock: Secondary | ICD-10-CM | POA: Diagnosis not present

## 2022-07-24 DIAGNOSIS — A419 Sepsis, unspecified organism: Secondary | ICD-10-CM | POA: Diagnosis not present

## 2022-07-24 DIAGNOSIS — Z720 Tobacco use: Secondary | ICD-10-CM | POA: Diagnosis not present

## 2022-07-24 DIAGNOSIS — L0390 Cellulitis, unspecified: Secondary | ICD-10-CM | POA: Diagnosis not present

## 2022-07-24 DIAGNOSIS — L03113 Cellulitis of right upper limb: Secondary | ICD-10-CM | POA: Diagnosis not present

## 2022-07-24 LAB — COMPREHENSIVE METABOLIC PANEL
ALT: 24 U/L (ref 0–44)
AST: 53 U/L — ABNORMAL HIGH (ref 15–41)
Albumin: 2.3 g/dL — ABNORMAL LOW (ref 3.5–5.0)
Alkaline Phosphatase: 49 U/L (ref 38–126)
Anion gap: 6 (ref 5–15)
BUN: 23 mg/dL — ABNORMAL HIGH (ref 6–20)
CO2: 29 mmol/L (ref 22–32)
Calcium: 8.2 mg/dL — ABNORMAL LOW (ref 8.9–10.3)
Chloride: 106 mmol/L (ref 98–111)
Creatinine, Ser: 1.09 mg/dL (ref 0.61–1.24)
GFR, Estimated: >60 mL/min (ref >60)
Glucose, Bld: 111 mg/dL — ABNORMAL HIGH (ref 70–99)
Potassium: 3.6 mmol/L (ref 3.5–5.1)
Sodium: 141 mmol/L (ref 135–145)
Total Bilirubin: 0.2 mg/dL — ABNORMAL LOW (ref 0.3–1.2)
Total Protein: 4.9 g/dL — ABNORMAL LOW (ref 6.5–8.1)

## 2022-07-24 LAB — C-REACTIVE PROTEIN: CRP: 7.3 mg/dL — ABNORMAL HIGH (ref <1.0)

## 2022-07-24 LAB — SEDIMENTATION RATE: Sed Rate: 15 mm/hr (ref 0–16)

## 2022-07-24 MED ORDER — DIPHENHYDRAMINE HCL 25 MG PO CAPS
25.0000 mg | ORAL_CAPSULE | Freq: Once | ORAL | Status: AC
  Administered 2022-07-24: 25 mg via ORAL
  Filled 2022-07-24: qty 1

## 2022-07-24 MED ORDER — SODIUM CHLORIDE 0.9 % IV SOLN
3.0000 g | Freq: Four times a day (QID) | INTRAVENOUS | Status: DC
  Administered 2022-07-24 – 2022-07-31 (×27): 3 g via INTRAVENOUS
  Filled 2022-07-24 (×27): qty 8

## 2022-07-24 NOTE — Progress Notes (Signed)
PROGRESS NOTE        PATIENT DETAILS Name: James Baker Age: 46 y.o. Sex: male Date of Birth: 1976/06/03 Admit Date: 07/20/2022 Admitting Physician Onnie Boer, MD JME:[id number], No Pcp Per  Brief Summary: Patient is a 46 y.o.  male with history of tobacco, occasional cocaine/marijuana use-who gave himself a tattoo in the right forearm-presented with yellow/greenish discharge surrounding the tattoo area-subsequent pain and swelling of his right arm.  He was found to have necrotizing tissue infection with abscess formation-and subsequently admitted to the [facility] service.  See below for further details.   Significant events: 8/20>> admit to [facility] at [facility]-right arm necrotizing infection/abscess formation. 8/21>> transfer to [facility] irrigation and debridement by orthopedics. 8/22>> transfer to [facility]  Significant studies: 8/20>> CT right humerus: Large enhancing abscess with gas-involving deltoid/biceps muscles.  Significant microbiology data: 8/20>> COVID/influenza PCR: Negative 8/20>> blood culture: No growth 8/21>> intraoperative cultures-right arm: Streptococcus constellatus 8/23>> intraoperative culture-right arm: Pending  Procedures: 8/21>> irrigation/debridement-right arm-by orthopedics at [facility] 8/23>> irrigation/debridement of right arm  Consults: Orthopedics.  Subjective: Lying comfortably in bed-significant other at bedside.  No major issues overnight.  Pain well controlled.  Objective: Vitals: Blood pressure 114/80, pulse 66, temperature 97.7 F (36.5 C), temperature source Oral, resp. rate 15, height 5\' 10"  (1.778 m), weight 61.2 kg, SpO2 97 %.   Exam: Gen Exam:Alert awake-not in any distress HEENT:atraumatic, normocephalic Chest: B/L clear to auscultation anteriorly CVS:S1S2 regular Abdomen:soft non tender, non distended Extremities:no edema Neurology: Non focal Skin: no rash   Pertinent Labs/Radiology:    Latest Ref Rng &  Units 07/23/2022    8:34 PM 07/23/2022   10:16 AM 07/23/2022    3:48 AM  CBC  WBC 4.0 - 10.5 K/uL 24.5  26.5  31.6   Hemoglobin 13.0 - 17.0 g/dL 07/25/2022  9.4  9.8   Hematocrit 39.0 - 52.0 % 35.3  28.4  29.5   Platelets 150 - 400 K/uL 501  322  338     Lab Results  Component Value Date   NA 141 07/24/2022   K 3.6 07/24/2022   CL 106 07/24/2022   CO2 29 07/24/2022      Assessment/Plan: Severe sepsis with necrotizing soft tissue infection involving right arm with abscess formation: Afebrile-leukocytosis downtrending-on Zyvox/Zosyn-we will consult ID for antibiotic recommendations-Dr. 07/26/2022 plans to take patient to the OR again on Friday for repeat wound closure and tissue graft.  Tobacco/cocaine/marijuana use: Counseled-denies IVDA.  BMI: Estimated body mass index is 19.37 kg/m as calculated from the following:   Height as of this encounter: 5\' 10"  (1.778 m).   Weight as of this encounter: 61.2 kg.   Code status:   Code Status: Full Code   DVT Prophylaxis: SCDs Start: 07/23/22 1927   Family Communication: Significant other at bedside   Disposition Plan: Status is: Inpatient Remains inpatient appropriate because: Necrotizing infection involving right arm-will likely remain inpatient for several more days-will require several more irrigation/debridement.  On empiric IV antibiotics.   Planned Discharge Destination:Home   Diet: Diet Order             Diet regular Room service appropriate? Yes; Fluid consistency: Thin  Diet effective now                     Antimicrobial agents: Anti-infectives (From admission, onward)  Start     Dose/Rate Route Frequency Ordered Stop   07/23/22 1430  ceFAZolin (ANCEF) IVPB 2g/100 mL premix        2 g 200 mL/hr over 30 Minutes Intravenous On call to O.R. 07/22/22 1910 07/23/22 1737   07/22/22 2200  linezolid (ZYVOX) IVPB 600 mg        600 mg 300 mL/hr over 60 Minutes Intravenous Every 12 hours 07/22/22 1354     07/22/22 1445   piperacillin-tazobactam (ZOSYN) IVPB 3.375 g        3.375 g 12.5 mL/hr over 240 Minutes Intravenous Every 8 hours 07/22/22 1353     07/21/22 2200  vancomycin (VANCOREADY) IVPB 750 mg/150 mL  Status:  Discontinued       See Hyperspace for full Linked Orders Report.   750 mg 150 mL/hr over 60 Minutes Intravenous Every 12 hours 07/21/22 1457 07/22/22 1354   07/21/22 1458  cefTRIAXone (ROCEPHIN) 2 g in sodium chloride 0.9 % 100 mL IVPB  Status:  Discontinued        2 g 200 mL/hr over 30 Minutes Intravenous Daily 07/21/22 1458 07/22/22 0742   07/21/22 0615  vancomycin (VANCOREADY) IVPB 750 mg/150 mL  Status:  Discontinued       See Hyperspace for full Linked Orders Report.   750 mg 150 mL/hr over 60 Minutes Intravenous Every 12 hours 07/20/22 1805 07/21/22 1457   07/20/22 1815  vancomycin (VANCOREADY) IVPB 1250 mg/250 mL       See Hyperspace for full Linked Orders Report.   1,250 mg 166.7 mL/hr over 90 Minutes Intravenous  Once 07/20/22 1805 07/20/22 2015   07/20/22 1800  metroNIDAZOLE (FLAGYL) IVPB 500 mg  Status:  Discontinued        500 mg 100 mL/hr over 60 Minutes Intravenous Every 12 hours 07/20/22 1749 07/22/22 1353   07/20/22 1615  cefTRIAXone (ROCEPHIN) 2 g in sodium chloride 0.9 % 100 mL IVPB  Status:  Discontinued        2 g 200 mL/hr over 30 Minutes Intravenous Every 24 hours 07/20/22 1613 07/21/22 1458        MEDICATIONS: Scheduled Meds:  acetaminophen  1,000 mg Oral TID   docusate sodium  100 mg Oral BID   nicotine  14 mg Transdermal Daily   Continuous Infusions:  sodium chloride     linezolid (ZYVOX) IV 600 mg (07/24/22 0941)   methocarbamol (ROBAXIN) IV     piperacillin-tazobactam (ZOSYN)  IV 3.375 g (07/24/22 0620)   PRN Meds:.acetaminophen, albuterol, bisacodyl, HYDROmorphone (DILAUDID) injection, methocarbamol **OR** methocarbamol (ROBAXIN) IV, metoCLOPramide **OR** metoCLOPramide (REGLAN) injection, morphine injection, ondansetron **OR** ondansetron (ZOFRAN) IV,  oxyCODONE, oxyCODONE, oxyCODONE, polyethylene glycol, simethicone   I have personally reviewed following labs and imaging studies  LABORATORY DATA: CBC: Recent Labs  Lab 07/20/22 1633 07/21/22 0533 07/22/22 0544 07/23/22 0348 07/23/22 1016 07/23/22 2034  WBC 22.1* 20.2* 29.2* 31.6* 26.5* 24.5*  NEUTROABS 15.7*  --   --   --  22.8* 17.6*  HGB 13.4 11.8* 10.4* 9.8* 9.4* 11.2*  HCT 41.2 36.6* 32.7* 29.5* 28.4* 35.3*  MCV 82.6 83.8 84.3 81.5 82.1 83.5  PLT 346 285 320 338 322 501*     Basic Metabolic Panel: Recent Labs  Lab 07/20/22 1633 07/21/22 0533 07/22/22 0544 07/23/22 0348 07/24/22 0247  NA 130* 138 141 140 141  K 3.8 3.6 3.9 4.1 3.6  CL 91* 107 111 107 106  CO2 30 25 25 24 29   GLUCOSE 122* 111* 159*  140* 111*  BUN 13 14 16 18  23*  CREATININE 0.97 0.96 0.73 0.75 1.09  CALCIUM 9.1 8.3* 8.8* 8.7* 8.2*     GFR: Estimated Creatinine Clearance: 74.9 mL/min (by C-G formula based on SCr of 1.09 mg/dL).  Liver Function Tests: Recent Labs  Lab 07/20/22 1633 07/24/22 0247  AST 26 53*  ALT 13 24  ALKPHOS 64 49  BILITOT 1.1 0.2*  PROT 7.2 4.9*  ALBUMIN 3.2* 2.3*    No results for input(s): "LIPASE", "AMYLASE" in the last 168 hours. No results for input(s): "AMMONIA" in the last 168 hours.  Coagulation Profile: Recent Labs  Lab 07/20/22 1633  INR 1.2     Cardiac Enzymes: No results for input(s): "CKTOTAL", "CKMB", "CKMBINDEX", "TROPONINI" in the last 168 hours.  BNP (last 3 results) No results for input(s): "PROBNP" in the last 8760 hours.  Lipid Profile: No results for input(s): "CHOL", "HDL", "LDLCALC", "TRIG", "CHOLHDL", "LDLDIRECT" in the last 72 hours.  Thyroid Function Tests: No results for input(s): "TSH", "T4TOTAL", "FREET4", "T3FREE", "THYROIDAB" in the last 72 hours.  Anemia Panel: No results for input(s): "VITAMINB12", "FOLATE", "FERRITIN", "TIBC", "IRON", "RETICCTPCT" in the last 72 hours.  Urine analysis:    Component Value  Date/Time   COLORURINE YELLOW 04/06/2011 0223   APPEARANCEUR CLEAR 04/06/2011 0223   LABSPEC <1.005 (L) 04/06/2011 0223   PHURINE 5.5 04/06/2011 0223   GLUCOSEU NEGATIVE 04/06/2011 0223   HGBUR NEGATIVE 04/06/2011 0223   BILIRUBINUR NEGATIVE 04/06/2011 0223   KETONESUR NEGATIVE 04/06/2011 0223   PROTEINUR NEGATIVE 04/06/2011 0223   UROBILINOGEN 0.2 04/06/2011 0223   NITRITE NEGATIVE 04/06/2011 0223   LEUKOCYTESUR  04/06/2011 0223    NEGATIVE MICROSCOPIC NOT DONE ON URINES WITH NEGATIVE PROTEIN, BLOOD, LEUKOCYTES, NITRITE, OR GLUCOSE <1000 mg/dL.    Sepsis Labs: Lactic Acid, Venous    Component Value Date/Time   LATICACIDVEN 1.1 07/20/2022 1856    MICROBIOLOGY: Recent Results (from the past 240 hour(s))  Resp Panel by RT-PCR (Flu A&B, Covid) Anterior Nasal Swab     Status: None   Collection Time: 07/20/22  4:13 PM   Specimen: Anterior Nasal Swab  Result Value Ref Range Status   SARS Coronavirus 2 by RT PCR NEGATIVE NEGATIVE Final    Comment: (NOTE) SARS-CoV-2 target nucleic acids are NOT DETECTED.  The SARS-CoV-2 RNA is generally detectable in upper respiratory specimens during the acute phase of infection. The lowest concentration of SARS-CoV-2 viral copies this assay can detect is 138 copies/mL. A negative result does not preclude SARS-Cov-2 infection and should not be used as the sole basis for treatment or other patient management decisions. A negative result may occur with  improper specimen collection/handling, submission of specimen other than nasopharyngeal swab, presence of viral mutation(s) within the areas targeted by this assay, and inadequate number of viral copies(<138 copies/mL). A negative result must be combined with clinical observations, patient history, and epidemiological information. The expected result is Negative.  Fact Sheet for Patients:  EntrepreneurPulse.com.au  Fact Sheet for Healthcare Providers:   IncredibleEmployment.be  This test is no t yet approved or cleared by the [place] FDA and  has been authorized for detection and/or diagnosis of SARS-CoV-2 by FDA under an Emergency Use Authorization (EUA). This EUA will remain  in effect (meaning this test can be used) for the duration of the COVID-19 declaration under Section 564(b)(1) of the Act, 21 U.S.C.section 360bbb-3(b)(1), unless the authorization is terminated  or revoked sooner.       Influenza A  by PCR NEGATIVE NEGATIVE Final   Influenza B by PCR NEGATIVE NEGATIVE Final    Comment: (NOTE) The Xpert Xpress SARS-CoV-2/FLU/RSV plus assay is intended as an aid in the diagnosis of influenza from Nasopharyngeal swab specimens and should not be used as a sole basis for treatment. Nasal washings and aspirates are unacceptable for Xpert Xpress SARS-CoV-2/FLU/RSV testing.  Fact Sheet for Patients: EntrepreneurPulse.com.au  Fact Sheet for Healthcare Providers: IncredibleEmployment.be  This test is not yet approved or cleared by the [place] FDA and has been authorized for detection and/or diagnosis of SARS-CoV-2 by FDA under an Emergency Use Authorization (EUA). This EUA will remain in effect (meaning this test can be used) for the duration of the COVID-19 declaration under Section 564(b)(1) of the Act, 21 U.S.C. section 360bbb-3(b)(1), unless the authorization is terminated or revoked.  Performed at [facility], [address], [place], [place] [address]   Blood Culture (routine x 2)     Status: None (Preliminary result)   Collection Time: 07/20/22  4:18 PM   Specimen: BLOOD LEFT FOREARM  Result Value Ref Range Status   Specimen Description   Final    BLOOD LEFT FOREARM BOTTLES DRAWN AEROBIC AND ANAEROBIC   Special Requests Blood Culture adequate volume  Final   Culture   Final    NO GROWTH 4 DAYS Performed at [facility], [address]  [place], [place] [address]    Report Status PENDING  Incomplete  Blood Culture (routine x 2)     Status: None (Preliminary result)   Collection Time: 07/20/22  4:33 PM   Specimen: Left Antecubital; Blood  Result Value Ref Range Status   Specimen Description   Final    LEFT ANTECUBITAL BOTTLES DRAWN AEROBIC AND ANAEROBIC   Special Requests Blood Culture adequate volume  Final   Culture   Final    NO GROWTH 4 DAYS Performed at [facility], [address], [place], [place] [address]    Report Status PENDING  Incomplete  MRSA Next Gen by PCR, Nasal     Status: None   Collection Time: 07/21/22 12:54 AM   Specimen: Nasal Mucosa; Nasal Swab  Result Value Ref Range Status   MRSA by PCR Next Gen NOT DETECTED NOT DETECTED Final    Comment: (NOTE) The GeneXpert MRSA Assay (FDA approved for NASAL specimens only), is one component of a comprehensive MRSA colonization surveillance program. It is not intended to diagnose MRSA infection nor to guide or monitor treatment for MRSA infections. Test performance is not FDA approved in patients less than 9 years old. Performed at [facility], [place] [address], [place], [place] [address]   Fungus Culture With Stain     Status: None (Preliminary result)   Collection Time: 07/21/22 10:23 AM   Specimen: PATH Other; Tissue  Result Value Ref Range Status   Fungus Stain Final report  Final    Comment: (NOTE) Performed At: [facility] [place], [place] [id number] Rush Farmer MD [id number]    Fungus (Mycology) Culture PENDING  Incomplete   Fungal Source ABSCESS  Final    Comment: Performed at [facility], [place] [address], [place], [place] [address]  Aerobic/Anaerobic Culture w Gram Stain (surgical/deep wound)     Status: None (Preliminary result)   Collection Time: 07/21/22 10:23 AM   Specimen: PATH Other; Tissue  Result Value Ref Range Status   Specimen Description ABSCESS  Final   Special Requests   RIGHT UPPER  ARM  Final   Gram Stain   Final    MODERATE WBC PRESENT,BOTH PMN AND MONONUCLEAR MODERATE GRAM NEGATIVE RODS MODERATE GRAM POSITIVE COCCI IN PAIRS AND CHAINS FEW GRAM POSITIVE RODS    Culture   Final    MODERATE STREPTOCOCCUS CONSTELLATUS CULTURE REINCUBATED FOR BETTER GROWTH Performed at [facility], [address], [place], [place] [address]    Report Status PENDING  Incomplete  Acid Fast Smear (AFB)     Status: None   Collection Time: 07/21/22 10:23 AM   Specimen: PATH Other; Tissue  Result Value Ref Range Status   AFB Specimen Processing Concentration  Final   Acid Fast Smear Negative  Final    Comment: (NOTE) Performed At: [facility] [address] [place], [place] [id number] Jolene Schimke MD [id number]    Source (AFB) ABSCESS  Final    Comment: Performed at [facility], [address] [place], [place] [address]  Fungus Culture Result     Status: None   Collection Time: 07/21/22 10:23 AM  Result Value Ref Range Status   Result 1 Comment  Final    Comment: (NOTE) KOH/Calcofluor preparation:  no fungus observed. Performed At: [facility] [address] [place], [place] [id number] Jolene Schimke MD [id number]   Aerobic/Anaerobic Culture w Gram Stain (surgical/deep wound)     Status: None (Preliminary result)   Collection Time: 07/23/22  5:44 PM   Specimen: Soft Tissue, Other  Result Value Ref Range Status   Specimen Description TISSUE  Final   Special Requests RIGHT ARM FASCIA  Final   Gram Stain NO ORGANISMS SEEN NO WBC SEEN   Final   Culture   Final    NO GROWTH < 12 HOURS Performed at [facility], [address] [place], [place] [address]    Report Status PENDING  Incomplete    RADIOLOGY STUDIES/RESULTS: No results found.   LOS: 4 days   Jeoffrey Massed, MD  [facility]    To contact the attending provider between 7A-7P or the covering provider during after hours 7P-7A, please log  into the web site [email or web address] and access using universal [place] password for that web site. If you do not have the password, please call the hospital operator.  07/24/2022, 11:25 AM

## 2022-07-24 NOTE — Progress Notes (Signed)
Patient ID: James Baker, male   DOB: 09/26/1976, 46 y.o.   MRN: [id number] Patient is postoperative day 1 status post debridement right arm for necrotizing fasciitis.  Fascia tissue was sent for cultures this is pending.  There is 100 cc in the wound VAC canister.  We will plan to return to the operating room on Friday and possible wound closure with tissue graft. 

## 2022-07-24 NOTE — H&P (View-Only) (Signed)
Patient ID: James Baker, male   DOB: Sep 22, 1976, 46 y.o.   MRN: [id number] Patient is postoperative day 1 status post debridement right arm for necrotizing fasciitis.  Fascia tissue was sent for cultures this is pending.  There is 100 cc in the wound VAC canister.  We will plan to return to the operating room on Friday and possible wound closure with tissue graft.

## 2022-07-24 NOTE — Progress Notes (Signed)
PT Cancellation Note  Patient Details Name: James Baker MRN: [id number] DOB: 04/15/1976   Cancelled Treatment:    Reason Eval/Treat Not Completed: Pain limiting ability to participate. Attempted to see pt for PT evaluation. RN requesting to allow pt to sleep in AM (due to not sleeping well last night) and when attempted in PM, pt reported already getting up and walking around and politely declined participation due to pain. Will attempt again as schedule allows.    Marlana Salvage Imagene Gurney PT, DPT  07/24/2022, 1:45 PM

## 2022-07-24 NOTE — Consult Note (Addendum)
[facility]                                                                                        Patient Identification: Patient Name: James Baker MRN: [id number] Admit Date: 07/20/2022  4:02 PM Today's Date: 07/24/2022 Reason for consult: Nec fasc  Requesting provider: Caryl ComesShankar Ghimire   Principal Problem:   Severe sepsis [facility](HCC) Active Problems:   Necrotizing cellulitis of right arm   Hyponatremia   Tobacco abuse   Cellulitis of right upper extremity   Antibiotics:  Vancomycin 8/20-8/22, linezolid 8/22-c Ceftriaxone 8/20-8/21 Metronidazole 8/20-8/21 Zosyn 8/21-c  Lines/Hardware: PIV  Assessment 46 year old male with PMH of asthma and substance use ( smoking, cocaine and marijuana) admitted with necrotizing fasciitis of the rt UE,   S/p I and D rt UE 8/21. OR cx growing strep constellatus, Actinomyces odontolyticus. Gram stain with GNR, GPR and GPC in pairs and chains. Per micro, GNR is Prevotella  S/p I&D of arm with wound VAC on 8/23. Or findings with thickened involved fascia  that was excised and sent for cultures and muscle was debrided and remaining muscle had good contractility and color  Recommendations  Complete 72 hrs of Linezolid for antitoxin effect  Switch zosyn to Unasyn, fu sensitivities  Fu OR cultures 8/21 and 8/23 Will do Hepatitis B and C serology  Fu CBC in the setting of leukocytosis, expect to downtrend with source control  Plan for wound closure and skin graft by Dr Lajoyce Cornersduda tomorrow.  D/w ID pharm D Following   Rest of the management as per the primary team. Please call with questions or concerns.  Thank you for the consult  Odette FractionSabina Aliea Bobe, MD Infectious Disease Physician [facility]  [facility] [address] [place], [place] [address] Phone: [phone]  Fax:  [phone]  __________________________________________________________________________________________________________ HPI and Hospital Course: 46 year old male with PMH of asthma and polysubstance abuse who presented to the ED on 8/20 with worsening pain and redness to the right upper arm.  He recently had a tattoo done by himself in his right arm about 3 weeks ago priro to ED visit and symptoms started on 8/17 after a heavy  pipe fell on his right upper arm at work..  The tattoo which is closer to his antecubital fossa developed an infection and had yellowish-green drainage from it, however drainage had resolved before the pipe fell on his arm on 8/17.  Denies fever or chills. Denies nausea, vomiting and diarrhea. Denies pain or swelling at other joints or extremities.  Denies IVDU but smokes 1 pack of cigarettes a day, snorts cocaine and marijuana denies alcohol use  At ED T max 102,  tachycardic, mildly tachypneic WBC 22.1 lactic acid 2 Imagings as below Concern for necrotizing soft tissue infection, discussed with surgery and admitted, started on broad-spectrum antibiotics with IV fluids S/p I and D rt UE 8/21. [facility]  Status post [facility].  Or findings with thickened involved facet that was excised and sent for cultures and muscle was  debrided and remaining muscle had good contractility and color  He is willing to be tested for Hepatitis B and C.   ROS: all systems reviewed with pertinent positives and negatives as listed above  Past Medical History:  Diagnosis Date   Asthma    Past Surgical History:  Procedure Laterality Date   APPLICATION OF WOUND VAC Right 07/23/2022   Procedure: APPLICATION OF WOUND VAC;  Surgeon: Nadara Mustard, MD;  Location: [facility] OR;  Service: Orthopedics;  Laterality: Right;   I & D EXTREMITY Right 07/21/2022   Procedure: IRRIGATION AND DEBRIDEMENT EXTREMITY;  Surgeon: Marlyne Beards, MD;   Location: [facility] OR;  Service: Orthopedics;  Laterality: Right;   I & D EXTREMITY Right 07/23/2022   Procedure: IRRIGATION AND DEBRIDEMENT ARM;  Surgeon: Nadara Mustard, MD;  Location: [facility] OR;  Service: Orthopedics;  Laterality: Right;     Scheduled Meds:  acetaminophen  1,000 mg Oral TID   docusate sodium  100 mg Oral BID   nicotine  14 mg Transdermal Daily   Continuous Infusions:  sodium chloride     linezolid (ZYVOX) IV 600 mg (07/24/22 0941)   methocarbamol (ROBAXIN) IV     piperacillin-tazobactam (ZOSYN)  IV 3.375 g (07/24/22 0620)   PRN Meds:.acetaminophen, albuterol, bisacodyl, HYDROmorphone (DILAUDID) injection, methocarbamol **OR** methocarbamol (ROBAXIN) IV, metoCLOPramide **OR** metoCLOPramide (REGLAN) injection, morphine injection, ondansetron **OR** ondansetron (ZOFRAN) IV, oxyCODONE, oxyCODONE, oxyCODONE, polyethylene glycol, simethicone  Allergies  Allergen Reactions   Hydrocodone Itching   Social History   Socioeconomic History   Marital status: Single    Spouse name: Not on file   Number of children: Not on file   Years of education: Not on file   Highest education level: Not on file  Occupational History   Not on file  Tobacco Use   Smoking status: Every Day    Packs/day: 1.00    Years: 15.00    Total pack years: 15.00    Types: Cigarettes   Smokeless tobacco: Never  Vaping Use   Vaping Use: Never used  Substance and Sexual Activity   Alcohol use: Yes    Alcohol/week: 3.0 standard drinks of alcohol    Types: 3 Cans of beer per week    Comment: occasionally   Drug use: No   Sexual activity: Never  Other Topics Concern   Not on file  Social History Narrative   Not on file   Social Determinants of Health   Financial Resource Strain: Not on file  Food Insecurity: Not on file  Transportation Needs: Not on file  Physical Activity: Not on file  Stress: Not on file  Social Connections: Not on file  Intimate Partner Violence: Not on file   Family  History  Problem Relation Age of Onset   Cancer Mother    Stroke Father    Seizures Father    Cancer Other      Vitals BP 114/80 (BP Location: Left Arm)   Pulse 66   Temp 97.7 F (36.5 C) (Oral)   Resp 15   Ht 5\' 10"  (1.778 m)   Wt 61.2 kg   SpO2 97%   BMI 19.37 kg/m   Physical Exam Constitutional:  sitting up in the bed, not in acute distress    Comments:   Cardiovascular:     Rate and Rhythm: Normal rate and regular rhythm.     Heart sounds:   Pulmonary:     Effort: Pulmonary effort is normal on room air  Comments:   Abdominal:     Palpations: Abdomen is soft.     Tenderness: non distended and non tender   Musculoskeletal:        General: No swelling or tenderness. Rt Upper extremity is bandaged and has a wound vac, No signs of septic joint in the left shoulder, Tattoo in the rt anterior chest   Skin:    Comments:   Neurological:     General: Grossly non focal, awake, alert and oriented   Psychiatric:        Mood and Affect: Mood normal.    Pertinent Microbiology Results for orders placed or performed during the hospital encounter of 07/20/22  Resp Panel by RT-PCR (Flu A&B, Covid) Anterior Nasal Swab     Status: None   Collection Time: 07/20/22  4:13 PM   Specimen: Anterior Nasal Swab  Result Value Ref Range Status   SARS Coronavirus 2 by RT PCR NEGATIVE NEGATIVE Final    Comment: (NOTE) SARS-CoV-2 target nucleic acids are NOT DETECTED.  The SARS-CoV-2 RNA is generally detectable in upper respiratory specimens during the acute phase of infection. The lowest concentration of SARS-CoV-2 viral copies this assay can detect is 138 copies/mL. A negative result does not preclude SARS-Cov-2 infection and should not be used as the sole basis for treatment or other patient management decisions. A negative result may occur with  improper specimen collection/handling, submission of specimen other than nasopharyngeal swab, presence of viral mutation(s) within  the areas targeted by this assay, and inadequate number of viral copies(<138 copies/mL). A negative result must be combined with clinical observations, patient history, and epidemiological information. The expected result is Negative.  Fact Sheet for Patients:  BloggerCourse.com  Fact Sheet for Healthcare Providers:  SeriousBroker.it  This test is no t yet approved or cleared by the [place] FDA and  has been authorized for detection and/or diagnosis of SARS-CoV-2 by FDA under an Emergency Use Authorization (EUA). This EUA will remain  in effect (meaning this test can be used) for the duration of the COVID-19 declaration under Section 564(b)(1) of the Act, 21 U.S.C.section 360bbb-3(b)(1), unless the authorization is terminated  or revoked sooner.       Influenza A by PCR NEGATIVE NEGATIVE Final   Influenza B by PCR NEGATIVE NEGATIVE Final    Comment: (NOTE) The Xpert Xpress SARS-CoV-2/FLU/RSV plus assay is intended as an aid in the diagnosis of influenza from Nasopharyngeal swab specimens and should not be used as a sole basis for treatment. Nasal washings and aspirates are unacceptable for Xpert Xpress SARS-CoV-2/FLU/RSV testing.  Fact Sheet for Patients: BloggerCourse.com  Fact Sheet for Healthcare Providers: SeriousBroker.it  This test is not yet approved or cleared by the [place] FDA and has been authorized for detection and/or diagnosis of SARS-CoV-2 by FDA under an Emergency Use Authorization (EUA). This EUA will remain in effect (meaning this test can be used) for the duration of the COVID-19 declaration under Section 564(b)(1) of the Act, 21 U.S.C. section 360bbb-3(b)(1), unless the authorization is terminated or revoked.  Performed at [facility], [address], [place], [place] [address]   Blood Culture (routine x 2)     Status: None (Preliminary  result)   Collection Time: 07/20/22  4:18 PM   Specimen: BLOOD LEFT FOREARM  Result Value Ref Range Status   Specimen Description   Final    BLOOD LEFT FOREARM BOTTLES DRAWN AEROBIC AND ANAEROBIC   Special Requests Blood Culture adequate volume  Final  Culture   Final    NO GROWTH 4 DAYS Performed at [facility], [address] [place], [place] [address]    Report Status PENDING  Incomplete  Blood Culture (routine x 2)     Status: None (Preliminary result)   Collection Time: 07/20/22  4:33 PM   Specimen: Left Antecubital; Blood  Result Value Ref Range Status   Specimen Description   Final    LEFT ANTECUBITAL BOTTLES DRAWN AEROBIC AND ANAEROBIC   Special Requests Blood Culture adequate volume  Final   Culture   Final    NO GROWTH 4 DAYS Performed at [facility], [address], [place], [place] [address]    Report Status PENDING  Incomplete  MRSA Next Gen by PCR, Nasal     Status: None   Collection Time: 07/21/22 12:54 AM   Specimen: Nasal Mucosa; Nasal Swab  Result Value Ref Range Status   MRSA by PCR Next Gen NOT DETECTED NOT DETECTED Final    Comment: (NOTE) The GeneXpert MRSA Assay (FDA approved for NASAL specimens only), is one component of a comprehensive MRSA colonization surveillance program. It is not intended to diagnose MRSA infection nor to guide or monitor treatment for MRSA infections. Test performance is not FDA approved in patients less than 58 years old. Performed at [facility], [address], [place], [place] [address]   Fungus Culture With Stain     Status: None (Preliminary result)   Collection Time: 07/21/22 10:23 AM   Specimen: PATH Other; Tissue  Result Value Ref Range Status   Fungus Stain Final report  Final    Comment: (NOTE) Performed At: [facility] [address] [place], [place] [id number] Jolene Schimke MD [id number]    Fungus (Mycology) Culture PENDING  Incomplete   Fungal Source ABSCESS  Final     Comment: Performed at [facility], [address], [place], [place] [address]  Aerobic/Anaerobic Culture w Gram Stain (surgical/deep wound)     Status: None (Preliminary result)   Collection Time: 07/21/22 10:23 AM   Specimen: PATH Other; Tissue  Result Value Ref Range Status   Specimen Description ABSCESS  Final   Special Requests  RIGHT UPPER ARM  Final   Gram Stain   Final    MODERATE WBC PRESENT,BOTH PMN AND MONONUCLEAR MODERATE GRAM NEGATIVE RODS MODERATE GRAM POSITIVE COCCI IN PAIRS AND CHAINS FEW GRAM POSITIVE RODS    Culture   Final    MODERATE STREPTOCOCCUS CONSTELLATUS CULTURE REINCUBATED FOR BETTER GROWTH Performed at [facility], [address] [place], [place] [address]    Report Status PENDING  Incomplete  Acid Fast Smear (AFB)     Status: None   Collection Time: 07/21/22 10:23 AM   Specimen: PATH Other; Tissue  Result Value Ref Range Status   AFB Specimen Processing Concentration  Final   Acid Fast Smear Negative  Final    Comment: (NOTE) Performed At: [facility] [address] [place], [place] [id number] Jolene Schimke MD YJ:[id number]    Source (AFB) ABSCESS  Final    Comment: Performed at [facility], [address] [place], [place] [address]  Fungus Culture Result     Status: None   Collection Time: 07/21/22 10:23 AM  Result Value Ref Range Status   Result 1 Comment  Final    Comment: (NOTE) KOH/Calcofluor preparation:  no fungus observed. Performed At: [facility] [address] [place], [place] [id number] Jolene Schimke MD  PP:[id number]   Aerobic/Anaerobic Culture w Gram Stain (surgical/deep wound)     Status: None (Preliminary result)   Collection Time: 07/23/22  5:44 PM   Specimen: Soft Tissue, Other  Result Value Ref Range Status   Specimen Description TISSUE  Final   Special Requests RIGHT ARM FASCIA  Final   Gram Stain NO ORGANISMS SEEN NO WBC SEEN   Final   Culture   Final    NO GROWTH < 12  HOURS Performed at [facility], [address], [place], [place] [address]    Report Status PENDING  Incomplete    Pertinent Lab seen by me:    Latest Ref Rng & Units 07/23/2022    8:34 PM 07/23/2022   10:16 AM 07/23/2022    3:48 AM  CBC  WBC 4.0 - 10.5 K/uL 24.5  26.5  31.6   Hemoglobin 13.0 - 17.0 g/dL 60.1  9.4  9.8   Hematocrit 39.0 - 52.0 % 35.3  28.4  29.5   Platelets 150 - 400 K/uL 501  322  338       Latest Ref Rng & Units 07/24/2022    2:47 AM 07/23/2022    3:48 AM 07/22/2022    5:44 AM  CMP  Glucose 70 - 99 mg/dL 093  235  573   BUN 6 - 20 mg/dL 23  18  16    Creatinine 0.61 - 1.24 mg/dL  2.20  2.54   Sodium 135 - 145 mmol/L 141  140  141   Potassium 3.5 - 5.1 mmol/L 3.6  4.1  3.9   Chloride 98 - 111 mmol/L 106  107  111   CO2 22 - 32 mmol/L 29  24  25    Calcium 8.9 - 10.3 mg/dL 8.2  8.7  8.8   Total Protein 6.5 - 8.1 g/dL 4.9     Total Bilirubin 0.3 - 1.2 mg/dL 0.2     Alkaline Phos 38 - 126 U/L 49     AST 15 - 41 U/L 53     ALT 0 - 44 U/L 24        Pertinent Imagings/Other Imagings Plain films and CT images have been personally visualized and interpreted; radiology reports have been reviewed. Decision making incorporated into the Impression / Recommendations.  CT HUMERUS RIGHT W CONTRAST  Result Date: 07/20/2022 CLINICAL DATA:  Right upper extremity cellulitis. EXAM: CT OF THE UPPER RIGHT EXTREMITY WITH CONTRAST TECHNIQUE: Multidetector CT imaging of the upper right extremity was performed according to the standard protocol following intravenous contrast administration. RADIATION DOSE REDUCTION: This exam was performed according to the departmental dose-optimization program which includes automated exposure control, adjustment of the mA and/or kV according to patient size and/or use of iterative reconstruction technique. CONTRAST:  19mL OMNIPAQUE IOHEXOL 300 MG/ML  SOLN COMPARISON:  Radiograph performed earlier on the same date. FINDINGS:  Bones/Joint/Cartilage No evidence of fracture or dislocation. No cortical erosion or periosteal reaction. Ligaments Suboptimally assessed by CT. Muscles and Tendons There is a large elongated peripherally enhancing low-density collection with gas in the subcutaneous soft tissues of the anterolateral arm. This process involves the anteroinferior deltoid and extends into the lateral aspect of the biceps muscle. In the deltoid this process measures at least 2.4 x 5.7 x 9.2 cm and superiorly in the anterior fibers of the deltoid it measures at least 2.4 x 1.9 x 6.2 cm. Soft tissues Subcutaneous soft tissue edema and skin thickening suggesting cellulitis. IMPRESSION: 1. Large  peripherally enhancing abscess with large amount of gas on the anterolateral aspect of the arm involving the deltoid and biceps muscles. The larger component in the biceps measures at least 2.4 x 5.7 x 9.2 cm and smaller component in the deltoid measures at least 2.4 x 1.9 x 6.2 cm. Findings most consistent with necrotizing abscess/collection. Surgical consultation for further management is recommended. 2.  Skin thickening and subcutaneous soft tissue edema. 3.  No acute osseous abnormality. Electronically Signed   By: Larose Hires D.O.   On: 07/20/2022 19:58   DG Humerus Right  Result Date: 07/20/2022 CLINICAL DATA:  Trauma. Right arm pain. Pipe fell on arm at work on Thursday, self tattoo few weeks ago. EXAM: RIGHT HUMERUS - 2+ VIEW COMPARISON:  None Available. FINDINGS: Cortical margins of the humerus are intact. There is no evidence of fracture or other focal bone lesions. Elbow and shoulder alignment are maintained. There is soft tissue gas overlying the mid upper aspect of the arm. Soft tissue edema is seen laterally. IMPRESSION: 1. No fracture or dislocation. 2. Moderate volume patchy soft tissue gas overlying the mid upper arm. In the absence of laceration, this is suspicious for necrotizing soft tissue infection. Overlying soft tissue  edema. These results were called by telephone at the time of interpretation on 07/20/2022 at 4:56 pm to provider [facility] ZAMMIT , who verbally acknowledged these results. Electronically Signed   By: Narda Rutherford M.D.   On: 07/20/2022 16:57    I spent 100 minutes for this patient encounter including review of prior medical records/discussing diagnostics and treatment plan with the patient/family/coordinate care with primary/other specialits with greater than 50% of time in face to face encounter.   Electronically signed by:   Odette Fraction, MD Infectious Disease Physician [facility] for [facility] Pager: [phone]

## 2022-07-25 ENCOUNTER — Encounter (HOSPITAL_COMMUNITY): Payer: Self-pay | Admitting: Internal Medicine

## 2022-07-25 ENCOUNTER — Inpatient Hospital Stay (HOSPITAL_COMMUNITY): Payer: 59 | Admitting: Certified Registered Nurse Anesthetist

## 2022-07-25 ENCOUNTER — Encounter (HOSPITAL_COMMUNITY)
Admission: EM | Discharge status: Against Medical Advice | Payer: Self-pay | Source: Home / Self Care | Attending: Internal Medicine

## 2022-07-25 ENCOUNTER — Other Ambulatory Visit: Payer: Self-pay

## 2022-07-25 ENCOUNTER — Inpatient Hospital Stay (HOSPITAL_COMMUNITY): Payer: Self-pay | Admitting: Certified Registered Nurse Anesthetist

## 2022-07-25 DIAGNOSIS — J449 Chronic obstructive pulmonary disease, unspecified: Secondary | ICD-10-CM

## 2022-07-25 DIAGNOSIS — S41101A Unspecified open wound of right upper arm, initial encounter: Secondary | ICD-10-CM | POA: Diagnosis not present

## 2022-07-25 DIAGNOSIS — A419 Sepsis, unspecified organism: Secondary | ICD-10-CM | POA: Diagnosis not present

## 2022-07-25 DIAGNOSIS — R6520 Severe sepsis without septic shock: Secondary | ICD-10-CM | POA: Diagnosis not present

## 2022-07-25 DIAGNOSIS — L0390 Cellulitis, unspecified: Secondary | ICD-10-CM | POA: Diagnosis not present

## 2022-07-25 DIAGNOSIS — F17210 Nicotine dependence, cigarettes, uncomplicated: Secondary | ICD-10-CM

## 2022-07-25 DIAGNOSIS — L03113 Cellulitis of right upper limb: Secondary | ICD-10-CM | POA: Diagnosis not present

## 2022-07-25 DIAGNOSIS — Z720 Tobacco use: Secondary | ICD-10-CM | POA: Diagnosis not present

## 2022-07-25 HISTORY — PX: I & D EXTREMITY: SHX5045

## 2022-07-25 LAB — CULTURE, BLOOD (ROUTINE X 2)
Culture: NO GROWTH
Culture: NO GROWTH
Special Requests: ADEQUATE
Special Requests: ADEQUATE

## 2022-07-25 LAB — HEPATITIS B SURFACE ANTIGEN: Hepatitis B Surface Ag: NONREACTIVE

## 2022-07-25 LAB — ACID FAST SMEAR (AFB, MYCOBACTERIA): Acid Fast Smear: NEGATIVE

## 2022-07-25 LAB — HEPATITIS C ANTIBODY: HCV Ab: NONREACTIVE

## 2022-07-25 LAB — HEPATITIS B CORE ANTIBODY, TOTAL: Hep B Core Total Ab: NONREACTIVE

## 2022-07-25 SURGERY — IRRIGATION AND DEBRIDEMENT EXTREMITY
Anesthesia: General | Site: Arm Lower | Laterality: Right

## 2022-07-25 MED ORDER — CEFAZOLIN SODIUM-DEXTROSE 2-4 GM/100ML-% IV SOLN
2.0000 g | INTRAVENOUS | Status: AC
  Administered 2022-07-25: 2 g via INTRAVENOUS

## 2022-07-25 MED ORDER — FENTANYL CITRATE (PF) 250 MCG/5ML IJ SOLN
INTRAMUSCULAR | Status: DC | PRN
Start: 2022-07-25 — End: 2022-07-25
  Administered 2022-07-25: 150 ug via INTRAVENOUS
  Administered 2022-07-25: 50 ug via INTRAVENOUS

## 2022-07-25 MED ORDER — MAGNESIUM CITRATE PO SOLN
1.0000 | Freq: Once | ORAL | Status: DC | PRN
Start: 2022-07-25 — End: 2022-07-25

## 2022-07-25 MED ORDER — POLYETHYLENE GLYCOL 3350 17 G PO PACK: 17.0000 g | PACK | Freq: Every day | ORAL | Status: DC | PRN

## 2022-07-25 MED ORDER — MELATONIN 5 MG PO TABS
5.0000 mg | ORAL_TABLET | Freq: Every evening | ORAL | Status: DC | PRN
Start: 2022-07-25 — End: 2022-08-02
  Administered 2022-07-25 – 2022-08-01 (×4): 5 mg via ORAL
  Filled 2022-07-25 (×4): qty 1

## 2022-07-25 MED ORDER — FENTANYL CITRATE (PF) 250 MCG/5ML IJ SOLN
INTRAMUSCULAR | Status: AC
  Filled 2022-07-25: qty 5

## 2022-07-25 MED ORDER — HYDROMORPHONE HCL 1 MG/ML IJ SOLN
0.2500 mg | INTRAMUSCULAR | Status: DC | PRN
  Administered 2022-07-25 (×4): 0.5 mg via INTRAVENOUS

## 2022-07-25 MED ORDER — CHLORHEXIDINE GLUCONATE 0.12 % MT SOLN: 15.0000 mL | Freq: Once | OROMUCOSAL | Status: AC

## 2022-07-25 MED ORDER — 0.9 % SODIUM CHLORIDE (POUR BTL) OPTIME
TOPICAL | Status: DC | PRN
  Administered 2022-07-25: 1000 mL

## 2022-07-25 MED ORDER — LACTATED RINGERS IV SOLN: INTRAVENOUS | Status: DC

## 2022-07-25 MED ORDER — LIDOCAINE 2% (20 MG/ML) 5 ML SYRINGE
INTRAMUSCULAR | Status: DC | PRN
  Administered 2022-07-25: 60 mg via INTRAVENOUS

## 2022-07-25 MED ORDER — SODIUM CHLORIDE 0.9 % IV SOLN: INTRAVENOUS | Status: DC

## 2022-07-25 MED ORDER — HYDROMORPHONE HCL 1 MG/ML IJ SOLN
INTRAMUSCULAR | Status: AC
  Filled 2022-07-25: qty 1

## 2022-07-25 MED ORDER — PROPOFOL 10 MG/ML IV BOLUS
INTRAVENOUS | Status: DC | PRN
  Administered 2022-07-25: 200 mg via INTRAVENOUS

## 2022-07-25 MED ORDER — CHLORHEXIDINE GLUCONATE 4 % EX LIQD: 60.0000 mL | Freq: Once | CUTANEOUS | Status: DC

## 2022-07-25 MED ORDER — DEXAMETHASONE SODIUM PHOSPHATE 10 MG/ML IJ SOLN
INTRAMUSCULAR | Status: AC
  Filled 2022-07-25: qty 1

## 2022-07-25 MED ORDER — ONDANSETRON HCL 4 MG/2ML IJ SOLN
INTRAMUSCULAR | Status: AC
  Filled 2022-07-25: qty 2

## 2022-07-25 MED ORDER — MIDAZOLAM HCL 2 MG/2ML IJ SOLN
INTRAMUSCULAR | Status: DC | PRN
  Administered 2022-07-25: 2 mg via INTRAVENOUS

## 2022-07-25 MED ORDER — CHLORHEXIDINE GLUCONATE 0.12 % MT SOLN
OROMUCOSAL | Status: AC
  Administered 2022-07-25: 15 mL via OROMUCOSAL
  Filled 2022-07-25: qty 15

## 2022-07-25 MED ORDER — ONDANSETRON HCL 4 MG/2ML IJ SOLN: 4.0000 mg | Freq: Four times a day (QID) | INTRAMUSCULAR | Status: DC | PRN

## 2022-07-25 MED ORDER — GLYCOPYRROLATE 0.2 MG/ML IJ SOLN
INTRAMUSCULAR | Status: DC | PRN
  Administered 2022-07-25: .2 mg via INTRAVENOUS

## 2022-07-25 MED ORDER — ONDANSETRON HCL 4 MG/2ML IJ SOLN
INTRAMUSCULAR | Status: DC | PRN
  Administered 2022-07-25: 4 mg via INTRAVENOUS

## 2022-07-25 MED ORDER — POVIDONE-IODINE 10 % EX SWAB
2.0000 | Freq: Once | CUTANEOUS | Status: AC
  Administered 2022-07-25: 2 via TOPICAL

## 2022-07-25 MED ORDER — LIDOCAINE 2% (20 MG/ML) 5 ML SYRINGE
INTRAMUSCULAR | Status: AC
  Filled 2022-07-25: qty 5

## 2022-07-25 MED ORDER — METHOCARBAMOL 1000 MG/10ML IJ SOLN: 500.0000 mg | Freq: Four times a day (QID) | INTRAVENOUS | Status: DC | PRN

## 2022-07-25 MED ORDER — ORAL CARE MOUTH RINSE: 15.0000 mL | Freq: Once | OROMUCOSAL | Status: AC

## 2022-07-25 MED ORDER — METHOCARBAMOL 500 MG PO TABS: 500.0000 mg | ORAL_TABLET | Freq: Four times a day (QID) | ORAL | Status: DC | PRN

## 2022-07-25 MED ORDER — ONDANSETRON HCL 4 MG PO TABS: 4.0000 mg | ORAL_TABLET | Freq: Four times a day (QID) | ORAL | Status: DC | PRN

## 2022-07-25 MED ORDER — MIDAZOLAM HCL 2 MG/2ML IJ SOLN
INTRAMUSCULAR | Status: AC
  Filled 2022-07-25: qty 2

## 2022-07-25 MED ORDER — CEFAZOLIN SODIUM-DEXTROSE 2-4 GM/100ML-% IV SOLN
INTRAVENOUS | Status: AC
  Filled 2022-07-25: qty 100

## 2022-07-25 MED ORDER — LACTATED RINGERS IV SOLN: INTRAVENOUS | Status: DC | PRN

## 2022-07-25 MED ORDER — METOCLOPRAMIDE HCL 5 MG/ML IJ SOLN: 5.0000 mg | Freq: Three times a day (TID) | INTRAMUSCULAR | Status: DC | PRN

## 2022-07-25 MED ORDER — METOCLOPRAMIDE HCL 5 MG PO TABS: 5.0000 mg | ORAL_TABLET | Freq: Three times a day (TID) | ORAL | Status: DC | PRN

## 2022-07-25 MED ORDER — ACETAMINOPHEN 500 MG PO TABS
1000.0000 mg | ORAL_TABLET | Freq: Once | ORAL | Status: AC
  Administered 2022-07-25: 1000 mg via ORAL

## 2022-07-25 MED ORDER — BISACODYL 10 MG RE SUPP: 10.0000 mg | Freq: Every day | RECTAL | Status: DC | PRN

## 2022-07-25 MED ORDER — DOCUSATE SODIUM 100 MG PO CAPS: 100.0000 mg | ORAL_CAPSULE | Freq: Two times a day (BID) | ORAL | Status: DC

## 2022-07-25 MED ORDER — DEXAMETHASONE SODIUM PHOSPHATE 10 MG/ML IJ SOLN
INTRAMUSCULAR | Status: DC | PRN
  Administered 2022-07-25: 10 mg via INTRAVENOUS

## 2022-07-25 MED ORDER — GLYCOPYRROLATE PF 0.2 MG/ML IJ SOSY
PREFILLED_SYRINGE | INTRAMUSCULAR | Status: AC
  Filled 2022-07-25: qty 1

## 2022-07-25 SURGICAL SUPPLY — 40 items
BAG COUNTER SPONGE SURGICOUNT (BAG) IMPLANT
BANDAGE ACE 4X5 VEL STRL LF (GAUZE/BANDAGES/DRESSINGS) IMPLANT
BLADE SURG 21 STRL SS (BLADE) ×1 IMPLANT
BNDG COHESIVE 4X5 TAN STRL LF (GAUZE/BANDAGES/DRESSINGS) IMPLANT
BNDG COHESIVE 6X5 TAN STRL LF (GAUZE/BANDAGES/DRESSINGS) IMPLANT
BNDG GAUZE ELAST 4 BULKY (GAUZE/BANDAGES/DRESSINGS) ×2 IMPLANT
CANISTER WOUNDNEG PRESSURE 500 (CANNISTER) IMPLANT
COVER SURGICAL LIGHT HANDLE (MISCELLANEOUS) ×2 IMPLANT
DRAPE DERMATAC (DRAPES) IMPLANT
DRAPE INCISE IOBAN 66X45 STRL (DRAPES) IMPLANT
DRAPE U-SHAPE 47X51 STRL (DRAPES) ×1 IMPLANT
DRESSING PREVENA PLUS CUSTOM (GAUZE/BANDAGES/DRESSINGS) IMPLANT
DRSG ADAPTIC 3X8 NADH LF (GAUZE/BANDAGES/DRESSINGS) ×1 IMPLANT
DRSG PAD ABDOMINAL 8X10 ST (GAUZE/BANDAGES/DRESSINGS) IMPLANT
DRSG PREVENA PLUS CUSTOM (GAUZE/BANDAGES/DRESSINGS) ×1
DURAPREP 26ML APPLICATOR (WOUND CARE) ×1 IMPLANT
ELECT REM PT RETURN 9FT ADLT (ELECTROSURGICAL)
ELECTRODE REM PT RTRN 9FT ADLT (ELECTROSURGICAL) IMPLANT
GAUZE SPONGE 4X4 12PLY STRL (GAUZE/BANDAGES/DRESSINGS) ×1 IMPLANT
GLOVE BIOGEL PI IND STRL 9 (GLOVE) ×1 IMPLANT
GLOVE BIOGEL PI INDICATOR 9 (GLOVE) ×1
GLOVE SURG ORTHO 9.0 STRL STRW (GLOVE) ×1 IMPLANT
GOWN STRL REUS W/ TWL XL LVL3 (GOWN DISPOSABLE) ×2 IMPLANT
GOWN STRL REUS W/TWL XL LVL3 (GOWN DISPOSABLE) ×2
GRAFT SKIN WND SURGICLOSE M95 (Tissue) IMPLANT
HANDPIECE INTERPULSE COAX TIP (DISPOSABLE)
KIT BASIN OR (CUSTOM PROCEDURE TRAY) ×1 IMPLANT
KIT TURNOVER KIT B (KITS) ×1 IMPLANT
MANIFOLD NEPTUNE II (INSTRUMENTS) ×1 IMPLANT
NS IRRIG 1000ML POUR BTL (IV SOLUTION) ×1 IMPLANT
PACK ORTHO EXTREMITY (CUSTOM PROCEDURE TRAY) ×1 IMPLANT
PAD ARMBOARD 7.5X6 YLW CONV (MISCELLANEOUS) ×2 IMPLANT
SET HNDPC FAN SPRY TIP SCT (DISPOSABLE) IMPLANT
STOCKINETTE IMPERVIOUS 9X36 MD (GAUZE/BANDAGES/DRESSINGS) IMPLANT
SUT ETHILON 2 0 PSLX (SUTURE) ×1 IMPLANT
SWAB COLLECTION DEVICE MRSA (MISCELLANEOUS) ×1 IMPLANT
SWAB CULTURE ESWAB REG 1ML (MISCELLANEOUS) IMPLANT
TOWEL GREEN STERILE (TOWEL DISPOSABLE) ×1 IMPLANT
TUBE CONNECTING 12X1/4 (SUCTIONS) ×1 IMPLANT
YANKAUER SUCT BULB TIP NO VENT (SUCTIONS) ×1 IMPLANT

## 2022-07-25 NOTE — Anesthesia Preprocedure Evaluation (Addendum)
Anesthesia Evaluation  Patient identified by MRN, date of birth, ID band Patient awake    Reviewed: Allergy & Precautions, NPO status , Patient's Chart, lab work & pertinent test results  History of Anesthesia Complications Negative for: history of anesthetic complications  Airway Mallampati: I  TM Distance: >3 FB Neck ROM: Limited    Dental  (+) Missing, Loose, Dental Advisory Given   Pulmonary asthma , COPD,  COPD inhaler, Current Smoker and Patient abstained from smoking.,    breath sounds clear to auscultation       Cardiovascular (-) anginanegative cardio ROS   Rhythm:Regular Rate:Normal     Neuro/Psych negative neurological ROS  negative psych ROS   GI/Hepatic negative GI ROS, (+)     substance abuse (UDS positive for amphetamines, benzos, and THC)  alcohol use and marijuana use,   Endo/Other  negative endocrine ROS  Renal/GU Na 130  negative genitourinary   Musculoskeletal   Abdominal   Peds  Hematology negative hematology ROS (+)   Anesthesia Other Findings sepsis secondary to right upper extremity necrotizing abscess/cellulitis  Reproductive/Obstetrics                            Anesthesia Physical Anesthesia Plan  ASA: 3  Anesthesia Plan: General   Post-op Pain Management: Tylenol PO (pre-op)* and Toradol IV (intra-op)*   Induction: Intravenous  PONV Risk Score and Plan: 1 and Treatment may vary due to age or medical condition, Midazolam, Dexamethasone and Ondansetron  Airway Management Planned: LMA  Additional Equipment: None  Intra-op Plan:   Post-operative Plan:   Informed Consent: I have reviewed the patients History and Physical, chart, labs and discussed the procedure including the risks, benefits and alternatives for the proposed anesthesia with the patient or authorized representative who has indicated his/her understanding and acceptance.     Dental  advisory given  Plan Discussed with: CRNA and Surgeon  Anesthesia Plan Comments:        Anesthesia Quick Evaluation

## 2022-07-25 NOTE — Op Note (Addendum)
07/25/2022  11:32 AM  PATIENT:  James Baker    PRE-OPERATIVE DIAGNOSIS:  Right Arm Wound  POST-OPERATIVE DIAGNOSIS:  Same  PROCEDURE:  RIGHT ARM DEBRIDEMENT TISSUE GRAFT with 95 cm of Kerecis micro powder. Local tissue rearrangement for wound closure 30 x 5 cm. Application of customizable wound VAC.  SURGEON:  Nadara Mustard, MD  PHYSICIAN ASSISTANT:None ANESTHESIA:   General  PREOPERATIVE INDICATIONS:  James Baker is a  46 y.o. male with a diagnosis of Right Arm Wound who failed conservative measures and elected for surgical management.    The risks benefits and alternatives were discussed with the patient preoperatively including but not limited to the risks of infection, bleeding, nerve injury, cardiopulmonary complications, the need for revision surgery, among others, and the patient was willing to proceed.  OPERATIVE IMPLANTS: Kerecis micro powder 95 cm for wound surface area 150 cm  @ENCIMAGES @  OPERATIVE FINDINGS: Patient's muscle was healthy and viable with good color and contractility.  No signs of residual infection.  OPERATIVE PROCEDURE: Patient was brought the operating room and underwent a general anesthetic.  After adequate levels anesthesia were obtained patient's right upper extremity was prepped using DuraPrep draped into a sterile field a timeout was called.  The incision was extended and this left a wound that was 30 x 5 cm.  The wound margins were healthy and viable.  The muscle was healthy and further debridement of the muscle was performed.  There was no undermining no signs of residual abscess.  The wound was irrigated with normal saline after debridement the wound bed was then covered with 95 cm of Kerecis micro powder for a wound surface area 150 cm.  Local tissue rearrangement was used to close the wound that was 150 cm with 2-0 nylon.  The Praveena customizable was applied this had a good suction fit patient was extubated taken the PACU in stable  condition.  Debridement type: Excisional Debridement  Side: right  Body Location: arm   Tools used for debridement: scalpel and rongeur  Pre-debridement Wound size (cm):   Length: 20        Width: 3     Depth: 1   Post-debridement Wound size (cm):   Length: 30        Width: 5     Depth: 1   Debridement depth beyond dead/damaged tissue down to healthy viable tissue: yes  Tissue layer involved: skin, subcutaneous tissue, muscle / fascia  Nature of tissue removed: Devitalized Tissue and Non-viable tissue  Irrigation volume: 1 liter     Irrigation fluid type: Normal Saline   DISCHARGE PLANNING:  Antibiotic duration: Continue IV antibiotics patient's white cell count is still elevated.  Cultures positive for strep.  Cultures being held for possible anaerobes.  Weightbearing: Not applicable  Pain medication: Opioid pathway  Dressing care/ Wound VAC: Wound VAC continue at discharge  Ambulatory devices: Not applicable  Discharge to: Anticipate discharge to home.  Follow-up: In the office 1 week post operative.

## 2022-07-25 NOTE — Progress Notes (Signed)
PT Cancellation Note  Patient Details Name: James Baker MRN: [id number] DOB: 1975/12/17   Cancelled Treatment:    Reason Eval/Treat Not Completed: Other (comment).  Pt declines PT and reports he is walking fine, no needs identified.  Agreed to let nursing know if he develops issues of being independent to walk.   Ivar Drape 07/25/2022, 1:15 PM  Samul Dada, PT PhD Acute Rehab Dept. Number: [facility] [id number] and [facility] [phone]

## 2022-07-25 NOTE — Progress Notes (Signed)
[facility] Infectious Diseases Follow Up Note  Patient Identification: Patient Name: James Baker MRN: [id number] Admit Date: 07/20/2022  4:02 PM Age: 46 y.o.Today's Date: 07/25/2022  Reason for Visit: Necrotizing fascitis   Principal Problem:   Severe sepsis [facility]) Active Problems:   Necrotizing cellulitis of right arm   Hyponatremia   Tobacco abuse   Cellulitis of right upper extremity  Antibiotics:  Vancomycin 8/20-8/22, linezolid 8/22-c Ceftriaxone 8/20-8/21 Metronidazole 8/20-8/21 Zosyn 8/21-8/23, Unasyn 8/24-c   Lines/Hardware: PIV  Interval Events: Remains afebrile, No CBC available today, no new culture update   Assessment 46 year old male with PMH of asthma and substance use ( smoking, cocaine and marijuana) admitted with necrotizing fasciitis of the rt UE,   S/p I and D rt UE 8/21. OR cx growing strep constellatus, Actinomyces odontolyticus. Gram stain with GNR, GPR and GPC in pairs and chains. Per micro, GNR is Prevotella   S/p I&D of arm with wound VAC on 8/23. Or findings with thickened involved fascia  that was excised and sent for cultures and muscle was debrided and remaining muscle had good contractility and color  S/p RT arm debridement with kerecis tissue graft, wound closure and application of wound vac 8/25. No signs of residual infection.  8/25 HCV ab NR, hep B surface ag negative, core ab total NR  Recommendations Continue Unasyn while inpatient  Fu OR cultures and sensitivities for de-escalation, pending Hepatitis serology Fu further surgical plans Fu CBC given significant leukocytosis, expect to downtrend with adequate source control Will plan for PO antibiotics for discharge like Augmentin  pending sensi. if improving clinically, WBC down trending and no further surgical plans. Will consider prolonged duration of abtx given isolation of Actinomyces from OR cultures.  Dr Thedore Mins covering this  weekend and will fu cultures. New ID team will follow from Monday.   Rest of the management as per the primary team. Thank you for the consult. Please page with pertinent questions or concerns.  ______________________________________________________________________ Subjective Patient in OR and could not be seen   Vitals BP (!) 144/88 (BP Location: Left Arm)   Pulse 67   Temp 97.8 F (36.6 C)   Resp 11   Ht 5\' 10"  (1.778 m)   Wt 61.2 kg   SpO2 96%   BMI 19.37 kg/m    Pertinent Microbiology Results for orders placed or performed during the hospital encounter of 07/20/22  Resp Panel by RT-PCR (Flu A&B, Covid) Anterior Nasal Swab     Status: None   Collection Time: 07/20/22  4:13 PM   Specimen: Anterior Nasal Swab  Result Value Ref Range Status   SARS Coronavirus 2 by RT PCR NEGATIVE NEGATIVE Final    Comment: (NOTE) SARS-CoV-2 target nucleic acids are NOT DETECTED.  The SARS-CoV-2 RNA is generally detectable in upper respiratory specimens during the acute phase of infection. The lowest concentration of SARS-CoV-2 viral copies this assay can detect is 138 copies/mL. A negative result does not preclude SARS-Cov-2 infection and should not be used as the sole basis for treatment or other patient management decisions. A negative result may occur with  improper specimen collection/handling, submission of specimen other than nasopharyngeal swab, presence of viral mutation(s) within the areas targeted by this assay, and inadequate number of viral copies(<138 copies/mL). A negative result must be combined with clinical observations, patient history, and epidemiological information. The expected result is Negative.  Fact Sheet for Patients:  07/22/22  Fact Sheet for Healthcare Providers:  BloggerCourse.com  This test is  no t yet approved or cleared by the [place] and  has been authorized for detection and/or  diagnosis of SARS-CoV-2 by FDA under an Emergency Use Authorization (EUA). This EUA will remain  in effect (meaning this test can be used) for the duration of the COVID-19 declaration under Section 564(b)(1) of the Act, 21 U.S.C.section 360bbb-3(b)(1), unless the authorization is terminated  or revoked sooner.       Influenza A by PCR NEGATIVE NEGATIVE Final   Influenza B by PCR NEGATIVE NEGATIVE Final    Comment: (NOTE) The Xpert Xpress SARS-CoV-2/FLU/RSV plus assay is intended as an aid in the diagnosis of influenza from Nasopharyngeal swab specimens and should not be used as a sole basis for treatment. Nasal washings and aspirates are unacceptable for Xpert Xpress SARS-CoV-2/FLU/RSV testing.  Fact Sheet for Patients: BloggerCourse.com  Fact Sheet for Healthcare Providers: SeriousBroker.it  This test is not yet approved or cleared by the [place] FDA and has been authorized for detection and/or diagnosis of SARS-CoV-2 by FDA under an Emergency Use Authorization (EUA). This EUA will remain in effect (meaning this test can be used) for the duration of the COVID-19 declaration under Section 564(b)(1) of the Act, 21 U.S.C. section 360bbb-3(b)(1), unless the authorization is terminated or revoked.  Performed at [facility], [address], [place], [place] [address]   Blood Culture (routine x 2)     Status: None   Collection Time: 07/20/22  4:18 PM   Specimen: BLOOD LEFT FOREARM  Result Value Ref Range Status   Specimen Description   Final    BLOOD LEFT FOREARM BOTTLES DRAWN AEROBIC AND ANAEROBIC   Special Requests Blood Culture adequate volume  Final   Culture   Final    NO GROWTH 5 DAYS Performed at [facility], [address], [place], [place] [address]    Report Status 07/25/2022 FINAL  Final  Blood Culture (routine x 2)     Status: None   Collection Time: 07/20/22  4:33 PM   Specimen: Left Antecubital; Blood   Result Value Ref Range Status   Specimen Description   Final    LEFT ANTECUBITAL BOTTLES DRAWN AEROBIC AND ANAEROBIC   Special Requests Blood Culture adequate volume  Final   Culture   Final    NO GROWTH 5 DAYS Performed at [facility], [address], [place], [place] [address]    Report Status 07/25/2022 FINAL  Final  MRSA Next Gen by PCR, Nasal     Status: None   Collection Time: 07/21/22 12:54 AM   Specimen: Nasal Mucosa; Nasal Swab  Result Value Ref Range Status   MRSA by PCR Next Gen NOT DETECTED NOT DETECTED Final    Comment: (NOTE) The GeneXpert MRSA Assay (FDA approved for NASAL specimens only), is one component of a comprehensive MRSA colonization surveillance program. It is not intended to diagnose MRSA infection nor to guide or monitor treatment for MRSA infections. Test performance is not FDA approved in patients less than 42 years old. Performed at [facility], [address], [place], [place] [address]   Fungus Culture With Stain     Status: None (Preliminary result)   Collection Time: 07/21/22 10:23 AM   Specimen: PATH Other; Tissue  Result Value Ref Range Status   Fungus Stain Final report  Final    Comment: (NOTE) Performed At: [facility] [address] [place], [place] [id number] Jolene Schimke MD KV:[id number]    Fungus (Mycology) Culture PENDING  Incomplete   Fungal Source ABSCESS  Final    Comment: Performed at [facility], [address], [place], [place] [address]  Aerobic/Anaerobic Culture w Gram Stain (surgical/deep wound)     Status: None (Preliminary result)   Collection Time: 07/21/22 10:23 AM   Specimen: PATH Other; Tissue  Result Value Ref Range Status   Specimen Description ABSCESS  Final   Special Requests  RIGHT UPPER ARM  Final   Gram Stain   Final    MODERATE WBC PRESENT,BOTH PMN AND MONONUCLEAR MODERATE GRAM NEGATIVE RODS MODERATE GRAM POSITIVE COCCI IN PAIRS AND CHAINS FEW GRAM POSITIVE RODS     Culture   Final    MODERATE STREPTOCOCCUS CONSTELLATUS FEW ACTINOMYCES ODONTOLYTICUS HOLDING FOR POSSIBLE ANAEROBE Performed at [facility], [address], [place], [place] [address]    Report Status PENDING  Incomplete  Acid Fast Smear (AFB)     Status: None   Collection Time: 07/21/22 10:23 AM   Specimen: PATH Other; Tissue  Result Value Ref Range Status   AFB Specimen Processing Concentration  Final   Acid Fast Smear Negative  Final    Comment: (NOTE) Performed At: [facility] [address] [place], [place] [id number] Jolene Schimke MD PO:[id number]    Source (AFB) ABSCESS  Final    Comment: Performed at [facility], [address] [place], [place] [address]  Fungus Culture Result     Status: None   Collection Time: 07/21/22 10:23 AM  Result Value Ref Range Status   Result 1 Comment  Final    Comment: (NOTE) KOH/Calcofluor preparation:  no fungus observed. Performed At: [facility] [address] [place], [place] [id number] Jolene Schimke MD DT:[id number]   Aerobic/Anaerobic Culture w Gram Stain (surgical/deep wound)     Status: None (Preliminary result)   Collection Time: 07/23/22  5:44 PM   Specimen: Soft Tissue, Other  Result Value Ref Range Status   Specimen Description TISSUE  Final   Special Requests RIGHT ARM FASCIA  Final   Gram Stain NO ORGANISMS SEEN NO WBC SEEN   Final   Culture   Final    NO GROWTH 2 DAYS NO ANAEROBES ISOLATED; CULTURE IN PROGRESS FOR 5 DAYS Performed at [facility], [address] [place], [place] [address]    Report Status PENDING  Incomplete  ' Pertinent Lab.    Latest Ref Rng & Units 07/23/2022    8:34 PM 07/23/2022   10:16 AM 07/23/2022    3:48 AM  CBC  WBC 4.0 - 10.5 K/uL 24.5  26.5  31.6   Hemoglobin 13.0 - 17.0 g/dL 25.0  9.4  9.8   Hematocrit 39.0 - 52.0 % 35.3  28.4  29.5   Platelets 150 - 400 K/uL 501  322  338       Latest Ref Rng & Units 07/24/2022    2:47 AM 07/23/2022     3:48 AM 07/22/2022    5:44 AM  CMP  Glucose 70 - 99 mg/dL 539  767  341   BUN 6 - 20 mg/dL 23  18  16    Creatinine 0.61 - 1.24 mg/dL  9.37  9.02   Sodium 135 - 145 mmol/L 141  140  141   Potassium 3.5 - 5.1 mmol/L 3.6  4.1  3.9   Chloride 98 - 111 mmol/L 106  107  111   CO2 22 - 32 mmol/L 29  24  25  Calcium 8.9 - 10.3 mg/dL 8.2  8.7  8.8   Total Protein 6.5 - 8.1 g/dL 4.9     Total Bilirubin 0.3 - 1.2 mg/dL 0.2     Alkaline Phos 38 - 126 U/L 49     AST 15 - 41 U/L 53     ALT 0 - 44 U/L 24        Pertinent Imaging today Plain films and CT images have been personally visualized and interpreted; radiology reports have been reviewed. Decision making incorporated into the Impression / Recommendations.  I spent 35  minutes for this patient encounter including review of prior medical records, coordination of care with primary/other specialist with greater than 50% of time being face to face/counseling and discussing diagnostics/treatment plan with the patient/family.  Electronically signed by:   Odette Fraction, MD Infectious Disease Physician [facility] for Infectious Disease Pager: [phone]

## 2022-07-25 NOTE — Interval H&P Note (Signed)
History and Physical Interval Note:  07/25/2022 6:47 AM  James Baker  has presented today for surgery, with the diagnosis of Right Arm Wound.  The various methods of treatment have been discussed with the patient and family. After consideration of risks, benefits and other options for treatment, the patient has consented to  Procedure(s): RIGHT ARM DEBRIDEMENT AND TISSUE GRAFT (Right) as a surgical intervention.  The patient's history has been reviewed, patient examined, no change in status, stable for surgery.  I have reviewed the patient's chart and labs.  Questions were answered to the patient's satisfaction.     Nadara Mustard

## 2022-07-25 NOTE — Progress Notes (Signed)
PROGRESS NOTE        PATIENT DETAILS Name: James Baker Age: 46 y.o. Sex: male Date of Birth: [birth year] Admit Date: 07/20/2022 Admitting Physician Bethena Roys, MD [id number], No Pcp Per  Brief Summary: Patient is a 46 y.o.  male with history of tobacco, occasional cocaine/marijuana use-who gave himself a tattoo in the right forearm-presented with yellow/greenish discharge surrounding the tattoo area-subsequent pain and swelling of his right arm.  He was found to have necrotizing tissue infection with abscess formation-and subsequently admitted to the [facility] service.  See below for further details.   Significant events: 8/20>> admit to [facility] at [facility]-right arm necrotizing infection/abscess formation. 8/21>> transfer to [facility] irrigation and debridement by orthopedics. 8/22>> transfer to [facility]  Significant studies: 8/20>> CT right humerus: Large enhancing abscess with gas-involving deltoid/biceps muscles.  Significant microbiology data: 8/20>> COVID/influenza PCR: Negative 8/20>> blood culture: No growth 8/21>> intraoperative cultures-right arm: Streptococcus constellatus, few actinomyces 8/23>> intraoperative culture-right arm: Negative  Procedures: 8/21>> irrigation/debridement-right arm-by orthopedics at [facility]. 8/23>> irrigation/debridement of right arm  Consults: Orthopedics.  Subjective: Lying comfortably in bed-significant other present as well-no major issues overnight.  Objective: Vitals: Blood pressure (!) 144/88, pulse 67, temperature 98.7 F (37.1 C), temperature source Oral, resp. rate 15, height 5\' 10"  (1.778 m), weight 61.2 kg, SpO2 96 %.   Exam: Gen Exam:Alert awake-not in any distress HEENT:atraumatic, normocephalic Chest: B/L clear to auscultation anteriorly CVS:S1S2 regular Abdomen:soft non tender, non distended Extremities:no edema Neurology: Non focal Skin: no rash   Pertinent Labs/Radiology:    Latest Ref Rng  & Units 07/23/2022    8:34 PM 07/23/2022   10:16 AM 07/23/2022    3:48 AM  CBC  WBC 4.0 - 10.5 K/uL 24.5  26.5  31.6   Hemoglobin 13.0 - 17.0 g/dL 11.2  9.4  9.8   Hematocrit 39.0 - 52.0 % 35.3  28.4  29.5   Platelets 150 - 400 K/uL 501  322  338     Lab Results  Component Value Date   NA 141 07/24/2022   K 3.6 07/24/2022   CL 106 07/24/2022   CO2 29 07/24/2022      Assessment/Plan: Severe sepsis with necrotizing soft tissue infection involving right arm with abscess formation: Afebrile-for repeat debridement-possible closure today.  ID following-and managing antibiotics-we will await further recommendations from ID and orthopedics.    Tobacco/cocaine/marijuana use: Counseled-denies IVDA.  BMI: Estimated body mass index is 19.37 kg/m as calculated from the following:   Height as of this encounter: 5\' 10"  (1.778 m).   Weight as of this encounter: 61.2 kg.   Code status:   Code Status: Full Code   DVT Prophylaxis: SCDs Start: 07/23/22 1927   Family Communication: Significant other at bedside   Disposition Plan: Status is: Inpatient Remains inpatient appropriate because: Necrotizing infection involving right arm-will likely remain inpatient for several more days-will require several more irrigation/debridement.  On empiric IV antibiotics.   Planned Discharge Destination:Home   Diet: Diet Order             Diet NPO time specified  Diet effective ____                     Antimicrobial agents: Anti-infectives (From admission, onward)    Start     Dose/Rate Route Frequency Ordered Stop   07/25/22 1000  ceFAZolin (ANCEF) IVPB 2g/100 mL premix        2 g 200 mL/hr over 30 Minutes Intravenous On call to O.R. 07/25/22 1324 07/26/22 0559   07/25/22 0936  ceFAZolin (ANCEF) 2-4 GM/100ML-% IVPB       Note to Pharmacy: Shanda Bumps M: cabinet override      07/25/22 0936 07/25/22 2144   07/24/22 1600  [MAR Hold]  Ampicillin-Sulbactam (UNASYN) 3 g in sodium  chloride 0.9 % 100 mL IVPB        (MAR Hold since Fri 07/25/2022 at 0943.Hold Reason: Transfer to a Procedural area)   3 g 200 mL/hr over 30 Minutes Intravenous Every 6 hours 07/24/22 1427     07/23/22 1430  ceFAZolin (ANCEF) IVPB 2g/100 mL premix        2 g 200 mL/hr over 30 Minutes Intravenous On call to O.R. 07/22/22 1910 07/23/22 1737   07/22/22 2200  [MAR Hold]  linezolid (ZYVOX) IVPB 600 mg        (MAR Hold since Fri 07/25/2022 at 0943.Hold Reason: Transfer to a Procedural area)   600 mg 300 mL/hr over 60 Minutes Intravenous Every 12 hours 07/22/22 1354 07/25/22 2359   07/22/22 1445  piperacillin-tazobactam (ZOSYN) IVPB 3.375 g  Status:  Discontinued        3.375 g 12.5 mL/hr over 240 Minutes Intravenous Every 8 hours 07/22/22 1353 07/24/22 1427   07/21/22 2200  vancomycin (VANCOREADY) IVPB 750 mg/150 mL  Status:  Discontinued       See Hyperspace for full Linked Orders Report.   750 mg 150 mL/hr over 60 Minutes Intravenous Every 12 hours 07/21/22 1457 07/22/22 1354   07/21/22 1458  cefTRIAXone (ROCEPHIN) 2 g in sodium chloride 0.9 % 100 mL IVPB  Status:  Discontinued        2 g 200 mL/hr over 30 Minutes Intravenous Daily 07/21/22 1458 07/22/22 0742   07/21/22 0615  vancomycin (VANCOREADY) IVPB 750 mg/150 mL  Status:  Discontinued       See Hyperspace for full Linked Orders Report.   750 mg 150 mL/hr over 60 Minutes Intravenous Every 12 hours 07/20/22 1805 07/21/22 1457   07/20/22 1815  vancomycin (VANCOREADY) IVPB 1250 mg/250 mL       See Hyperspace for full Linked Orders Report.   1,250 mg 166.7 mL/hr over 90 Minutes Intravenous  Once 07/20/22 1805 07/20/22 2015   07/20/22 1800  metroNIDAZOLE (FLAGYL) IVPB 500 mg  Status:  Discontinued        500 mg 100 mL/hr over 60 Minutes Intravenous Every 12 hours 07/20/22 1749 07/22/22 1353   07/20/22 1615  cefTRIAXone (ROCEPHIN) 2 g in sodium chloride 0.9 % 100 mL IVPB  Status:  Discontinued        2 g 200 mL/hr over 30 Minutes Intravenous  Every 24 hours 07/20/22 1613 07/21/22 1458        MEDICATIONS: Scheduled Meds:  [MAR Hold] acetaminophen  1,000 mg Oral TID   chlorhexidine  60 mL Topical Once   [MAR Hold] docusate sodium  100 mg Oral BID   [MAR Hold] nicotine  14 mg Transdermal Daily   Continuous Infusions:  sodium chloride     [MAR Hold] ampicillin-sulbactam (UNASYN) IV 3 g (07/25/22 0414)   ceFAZolin      ceFAZolin (ANCEF) IV     lactated ringers 10 mL/hr at 07/25/22 1001   [MAR Hold] linezolid (ZYVOX) IV 600 mg (07/24/22 2303)   [MAR Hold] methocarbamol (ROBAXIN) IV  PRN Meds:.[MAR Hold] acetaminophen, [MAR Hold] albuterol, [MAR Hold] bisacodyl, ceFAZolin, [MAR Hold]  HYDROmorphone (DILAUDID) injection, [MAR Hold] methocarbamol **OR** [MAR Hold] methocarbamol (ROBAXIN) IV, [MAR Hold] metoCLOPramide **OR** [MAR Hold] metoCLOPramide (REGLAN) injection, [MAR Hold]  morphine injection, [MAR Hold] ondansetron **OR** [MAR Hold] ondansetron (ZOFRAN) IV, [MAR Hold] oxyCODONE, [MAR Hold] oxyCODONE, [MAR Hold] oxyCODONE, [MAR Hold] polyethylene glycol, [MAR Hold] simethicone   I have personally reviewed following labs and imaging studies  LABORATORY DATA: CBC: Recent Labs  Lab 07/20/22 1633 07/21/22 0533 07/22/22 0544 07/23/22 0348 07/23/22 1016 07/23/22 2034  WBC 22.1* 20.2* 29.2* 31.6* 26.5* 24.5*  NEUTROABS 15.7*  --   --   --  22.8* 17.6*  HGB 13.4 11.8* 10.4* 9.8* 9.4* 11.2*  HCT 41.2 36.6* 32.7* 29.5* 28.4* 35.3*  MCV 82.6 83.8 84.3 81.5 82.1 83.5  PLT 346 285 320 338 322 501*     Basic Metabolic Panel: Recent Labs  Lab 07/20/22 1633 07/21/22 0533 07/22/22 0544 07/23/22 0348 07/24/22 0247  NA 130* 138 141 140 141  K 3.8 3.6 3.9 4.1 3.6  CL 91* 107 111 107 106  CO2 30 25 25 24 29   GLUCOSE 122* 111* 159* 140* 111*  BUN 13 14 16 18  23*  CREATININE 0.97 0.96 0.73 0.75 1.09  CALCIUM 9.1 8.3* 8.8* 8.7* 8.2*     GFR: Estimated Creatinine Clearance: 74.9 mL/min (by C-G formula based on  SCr of 1.09 mg/dL).  Liver Function Tests: Recent Labs  Lab 07/20/22 1633 07/24/22 0247  AST 26 53*  ALT 13 24  ALKPHOS 64 49  BILITOT 1.1 0.2*  PROT 7.2 4.9*  ALBUMIN 3.2* 2.3*    No results for input(s): "LIPASE", "AMYLASE" in the last 168 hours. No results for input(s): "AMMONIA" in the last 168 hours.  Coagulation Profile: Recent Labs  Lab 07/20/22 1633  INR 1.2     Cardiac Enzymes: No results for input(s): "CKTOTAL", "CKMB", "CKMBINDEX", "TROPONINI" in the last 168 hours.  BNP (last 3 results) No results for input(s): "PROBNP" in the last 8760 hours.  Lipid Profile: No results for input(s): "CHOL", "HDL", "LDLCALC", "TRIG", "CHOLHDL", "LDLDIRECT" in the last 72 hours.  Thyroid Function Tests: No results for input(s): "TSH", "T4TOTAL", "FREET4", "T3FREE", "THYROIDAB" in the last 72 hours.  Anemia Panel: No results for input(s): "VITAMINB12", "FOLATE", "FERRITIN", "TIBC", "IRON", "RETICCTPCT" in the last 72 hours.  Urine analysis:    Component Value Date/Time   COLORURINE YELLOW 04/06/2011 0223   APPEARANCEUR CLEAR 04/06/2011 0223   LABSPEC <1.005 (L) 04/06/2011 0223   PHURINE 5.5 04/06/2011 0223   GLUCOSEU NEGATIVE 04/06/2011 0223   HGBUR NEGATIVE 04/06/2011 0223   BILIRUBINUR NEGATIVE 04/06/2011 0223   KETONESUR NEGATIVE 04/06/2011 0223   PROTEINUR NEGATIVE 04/06/2011 0223   UROBILINOGEN 0.2 04/06/2011 0223   NITRITE NEGATIVE 04/06/2011 0223   LEUKOCYTESUR  04/06/2011 0223    NEGATIVE MICROSCOPIC NOT DONE ON URINES WITH NEGATIVE PROTEIN, BLOOD, LEUKOCYTES, NITRITE, OR GLUCOSE <1000 mg/dL.    Sepsis Labs: Lactic Acid, Venous    Component Value Date/Time   LATICACIDVEN 1.1 07/20/2022 1856    MICROBIOLOGY: Recent Results (from the past 240 hour(s))  Resp Panel by RT-PCR (Flu A&B, Covid) Anterior Nasal Swab     Status: None   Collection Time: 07/20/22  4:13 PM   Specimen: Anterior Nasal Swab  Result Value Ref Range Status   SARS Coronavirus 2  by RT PCR NEGATIVE NEGATIVE Final    Comment: (NOTE) SARS-CoV-2 target nucleic acids are NOT DETECTED.  The SARS-CoV-2 RNA is generally detectable in upper respiratory specimens during the acute phase of infection. The lowest concentration of SARS-CoV-2 viral copies this assay can detect is 138 copies/mL. A negative result does not preclude SARS-Cov-2 infection and should not be used as the sole basis for treatment or other patient management decisions. A negative result may occur with  improper specimen collection/handling, submission of specimen other than nasopharyngeal swab, presence of viral mutation(s) within the areas targeted by this assay, and inadequate number of viral copies(<138 copies/mL). A negative result must be combined with clinical observations, patient history, and epidemiological information. The expected result is Negative.  Fact Sheet for Patients:  EntrepreneurPulse.com.au  Fact Sheet for Healthcare Providers:  IncredibleEmployment.be  This test is no t yet approved or cleared by the [place] FDA and  has been authorized for detection and/or diagnosis of SARS-CoV-2 by FDA under an Emergency Use Authorization (EUA). This EUA will remain  in effect (meaning this test can be used) for the duration of the COVID-19 declaration under Section 564(b)(1) of the Act, 21 U.S.C.section 360bbb-3(b)(1), unless the authorization is terminated  or revoked sooner.       Influenza A by PCR NEGATIVE NEGATIVE Final   Influenza B by PCR NEGATIVE NEGATIVE Final    Comment: (NOTE) The Xpert Xpress SARS-CoV-2/FLU/RSV plus assay is intended as an aid in the diagnosis of influenza from Nasopharyngeal swab specimens and should not be used as a sole basis for treatment. Nasal washings and aspirates are unacceptable for Xpert Xpress SARS-CoV-2/FLU/RSV testing.  Fact Sheet for Patients: EntrepreneurPulse.com.au  Fact  Sheet for Healthcare Providers: IncredibleEmployment.be  This test is not yet approved or cleared by the [place] FDA and has been authorized for detection and/or diagnosis of SARS-CoV-2 by FDA under an Emergency Use Authorization (EUA). This EUA will remain in effect (meaning this test can be used) for the duration of the COVID-19 declaration under Section 564(b)(1) of the Act, 21 U.S.C. section 360bbb-3(b)(1), unless the authorization is terminated or revoked.  Performed at [facility], [address] [place], [place] [address]   Blood Culture (routine x 2)     Status: None   Collection Time: 07/20/22  4:18 PM   Specimen: BLOOD LEFT FOREARM  Result Value Ref Range Status   Specimen Description   Final    BLOOD LEFT FOREARM BOTTLES DRAWN AEROBIC AND ANAEROBIC   Special Requests Blood Culture adequate volume  Final   Culture   Final    NO GROWTH 5 DAYS Performed at [facility], [address] [place], [place] [address]    Report Status 07/25/2022 FINAL  Final  Blood Culture (routine x 2)     Status: None   Collection Time: 07/20/22  4:33 PM   Specimen: Left Antecubital; Blood  Result Value Ref Range Status   Specimen Description   Final    LEFT ANTECUBITAL BOTTLES DRAWN AEROBIC AND ANAEROBIC   Special Requests Blood Culture adequate volume  Final   Culture   Final    NO GROWTH 5 DAYS Performed at [facility], [address], [place], [place] [address]    Report Status 07/25/2022 FINAL  Final  MRSA Next Gen by PCR, Nasal     Status: None   Collection Time: 07/21/22 12:54 AM   Specimen: Nasal Mucosa; Nasal Swab  Result Value Ref Range Status   MRSA by PCR Next Gen NOT DETECTED NOT DETECTED Final    Comment: (NOTE) The GeneXpert MRSA Assay (FDA approved  for NASAL specimens only), is one component of a comprehensive MRSA colonization surveillance program. It is not intended to diagnose MRSA infection nor to guide or monitor treatment for MRSA  infections. Test performance is not FDA approved in patients less than 32 years old. Performed at [facility], [place] [address] [place], [place] [address]   Fungus Culture With Stain     Status: None (Preliminary result)   Collection Time: 07/21/22 10:23 AM   Specimen: PATH Other; Tissue  Result Value Ref Range Status   Fungus Stain Final report  Final    Comment: (NOTE) Performed At: [facility] [place], [place] [id number] Rush Farmer MD [id number]    Fungus (Mycology) Culture PENDING  Incomplete   Fungal Source ABSCESS  Final    Comment: Performed at [facility], [place] [address] [place], [place] [address]  Aerobic/Anaerobic Culture w Gram Stain (surgical/deep wound)     Status: None (Preliminary result)   Collection Time: 07/21/22 10:23 AM   Specimen: PATH Other; Tissue  Result Value Ref Range Status   Specimen Description ABSCESS  Final   Special Requests  RIGHT UPPER ARM  Final   Gram Stain   Final    MODERATE WBC PRESENT,BOTH PMN AND MONONUCLEAR MODERATE GRAM NEGATIVE RODS MODERATE GRAM POSITIVE COCCI IN PAIRS AND CHAINS FEW GRAM POSITIVE RODS    Culture   Final    MODERATE STREPTOCOCCUS CONSTELLATUS FEW ACTINOMYCES ODONTOLYTICUS HOLDING FOR POSSIBLE ANAEROBE Performed at [facility], [place] [address] [place], [place] [address]    Report Status PENDING  Incomplete  Acid Fast Smear (AFB)     Status: None   Collection Time: 07/21/22 10:23 AM   Specimen: PATH Other; Tissue  Result Value Ref Range Status   AFB Specimen Processing Concentration  Final   Acid Fast Smear Negative  Final    Comment: (NOTE) Performed At: [facility] [place], [place] [id number] Rush Farmer MD [id number]    Source (AFB) ABSCESS  Final    Comment: Performed at [facility], [place] [address], [place], [place] [address]  Fungus Culture Result     Status: None   Collection Time: 07/21/22 10:23 AM   Result Value Ref Range Status   Result 1 Comment  Final    Comment: (NOTE) KOH/Calcofluor preparation:  no fungus observed. Performed At: [facility] [place], [place] [id number] Rush Farmer MD [id number]   Aerobic/Anaerobic Culture w Gram Stain (surgical/deep wound)     Status: None (Preliminary result)   Collection Time: 07/23/22  5:44 PM   Specimen: Soft Tissue, Other  Result Value Ref Range Status   Specimen Description TISSUE  Final   Special Requests RIGHT ARM FASCIA  Final   Gram Stain NO ORGANISMS SEEN NO WBC SEEN   Final   Culture   Final    NO GROWTH 2 DAYS NO ANAEROBES ISOLATED; CULTURE IN PROGRESS FOR 5 DAYS Performed at [facility], [place] [address] [place],  [address]    Report Status PENDING  Incomplete    RADIOLOGY STUDIES/RESULTS: No results found.   LOS: 5 days   Oren Binet, MD  [facility]    To contact the attending provider between 7A-7P or the covering provider during after hours 7P-7A, please log into the web site [email or web address] and access using universal [place] password for that web site. If you do not have the password, please call  the hospital operator.  07/25/2022, 10:04 AM

## 2022-07-25 NOTE — Progress Notes (Signed)
PT Cancellation Note  Patient Details Name: James Baker MRN: [id number] DOB: July 18, 1976   Cancelled Treatment:    Reason Eval/Treat Not Completed: Patient at procedure or test/unavailable.  At procedure and will see at another time.   Ivar Drape 07/25/2022, 10:47 AM  Samul Dada, PT PhD Acute Rehab Dept. Number: [facility] [id number] and [facility] [phone]

## 2022-07-25 NOTE — Transfer of Care (Signed)
Immediate Anesthesia Transfer of Care Note  Patient: James Baker  Procedure(s) Performed: RIGHT ARM DEBRIDEMENT AND TISSUE GRAFT (Right: Arm Lower)  Patient Location: PACU  Anesthesia Type:General  Level of Consciousness: awake, alert , oriented and patient cooperative  Airway & Oxygen Therapy: Patient Spontanous Breathing and Patient connected to nasal cannula oxygen  Post-op Assessment: Report given to RN, Post -op Vital signs reviewed and stable and Patient moving all extremities X 4  Post vital signs: Reviewed and stable  Last Vitals:  Vitals Value Taken Time  BP 159/115 07/25/22 1130  Temp    Pulse 85 07/25/22 1131  Resp 8 07/25/22 1131  SpO2 98 % 07/25/22 1131  Vitals shown include unvalidated device data.  Last Pain:  Vitals:   07/25/22 0957  TempSrc: Oral  PainSc: 7       Patients Stated Pain Goal: 0 (07/23/22 1500)  Complications: No notable events documented.

## 2022-07-25 NOTE — Anesthesia Postprocedure Evaluation (Signed)
Anesthesia Post Note  Patient: James Baker  Procedure(s) Performed: RIGHT ARM DEBRIDEMENT AND TISSUE GRAFT (Right: Arm Lower)     Patient location during evaluation: PACU Anesthesia Type: General Level of consciousness: awake and alert, patient cooperative and oriented Pain control: pain improving. Vital Signs Assessment: post-procedure vital signs reviewed and stable Respiratory status: spontaneous breathing, nonlabored ventilation and respiratory function stable Cardiovascular status: blood pressure returned to baseline and stable Postop Assessment: no apparent nausea or vomiting Anesthetic complications: no   No notable events documented.  Last Vitals:  Vitals:   07/25/22 1145 07/25/22 1200  BP: (!) 174/104 (!) 169/103  Pulse: 66 (!) 52  Resp: 18 13  Temp:    SpO2: 98% 95%    Last Pain:  Vitals:   07/25/22 1145  TempSrc:   PainSc: 8                  Jamesha Ellsworth,E. Josalyn Dettmann

## 2022-07-25 NOTE — Anesthesia Procedure Notes (Signed)
Procedure Name: LMA Insertion Date/Time: 07/25/2022 10:43 AM  Performed by: Melina Schools, CRNAPre-anesthesia Checklist: Patient identified, Emergency Drugs available, Suction available, Patient being monitored and Timeout performed Patient Re-evaluated:Patient Re-evaluated prior to induction Oxygen Delivery Method: Circle system utilized Preoxygenation: Pre-oxygenation with 100% oxygen Induction Type: IV induction Ventilation: Mask ventilation without difficulty LMA: LMA inserted LMA Size: 4.0 Number of attempts: 1 Placement Confirmation: positive ETCO2 and breath sounds checked- equal and bilateral Tube secured with: Tape Dental Injury: Teeth and Oropharynx as per pre-operative assessment

## 2022-07-26 DIAGNOSIS — R6520 Severe sepsis without septic shock: Secondary | ICD-10-CM | POA: Diagnosis not present

## 2022-07-26 DIAGNOSIS — A419 Sepsis, unspecified organism: Secondary | ICD-10-CM | POA: Diagnosis not present

## 2022-07-26 DIAGNOSIS — Z720 Tobacco use: Secondary | ICD-10-CM | POA: Diagnosis not present

## 2022-07-26 DIAGNOSIS — L0390 Cellulitis, unspecified: Secondary | ICD-10-CM | POA: Diagnosis not present

## 2022-07-26 LAB — HEPATITIS B SURFACE ANTIBODY, QUANTITATIVE: Hep B S AB Quant (Post): <3.1 m[IU]/mL — ABNORMAL LOW (ref >9.9)

## 2022-07-26 LAB — CBC
HCT: 28.9 % — ABNORMAL LOW (ref 39.0–52.0)
Hemoglobin: 9.5 g/dL — ABNORMAL LOW (ref 13.0–17.0)
MCH: 26.8 pg (ref 26.0–34.0)
MCHC: 32.9 g/dL (ref 30.0–36.0)
MCV: 81.6 fL (ref 80.0–100.0)
Platelets: 433 10*3/uL — ABNORMAL HIGH (ref 150–400)
RBC: 3.54 MIL/uL — ABNORMAL LOW (ref 4.22–5.81)
RDW: 16.2 % — ABNORMAL HIGH (ref 11.5–15.5)
WBC: 15.8 10*3/uL — ABNORMAL HIGH (ref 4.0–10.5)
nRBC: 0 % (ref 0.0–0.2)

## 2022-07-26 NOTE — Progress Notes (Signed)
Patient ID: James Baker, male   DOB: 1976/06/21, 46 y.o.   MRN: [id number] Patient is postoperative day 1 debridement and wound closure right arm.  The wound VAC is functioning well.  Patient may be discharged with the Praveena plus portable wound VAC pump.  Patient is stable from orthopedic standpoint for discharge at this time.  Of note patient does have a new area of swelling mid forearm on the ulnar border.  This may be secondary to pressure he has no pain with active range of motion of his hand or wrist.  There is no redness or cellulitis this does not appear infected.

## 2022-07-26 NOTE — Progress Notes (Signed)
Orthopedic Tech Progress Note Patient Details:  GLENDA KUNST 10-10-1976 [id number]  Ortho Devices Type of Ortho Device: Arm sling Ortho Device/Splint Location: Right arm Ortho Device/Splint Interventions: Application   Post Interventions Patient Tolerated: Well  Genelle Bal Shalie Schremp 07/26/2022, 9:21 AM

## 2022-07-26 NOTE — Progress Notes (Signed)
PROGRESS NOTE        PATIENT DETAILS Name: James Baker Age: [age] y.o. Sex: male Date of Birth: 06/06/1976 Admit Date: 07/20/2022 Admitting Physician Onnie BoerEjiroghene E Emokpae, MD ZOX:[id number]:Patient, No Pcp Per  Brief Summary: Patient is a 46 y.o.  male with history of tobacco, occasional cocaine/marijuana use-who gave himself a tattoo in the right forearm-presented with yellow/greenish discharge surrounding the tattoo area-subsequent pain and swelling of his right arm.  He was found to have necrotizing tissue infection with abscess formation-and [facility]  See below for further details.   Significant events: 8/20>> admit to [facility] at [facility]-right arm necrotizing infection/abscess formation. 8/21>> [facility]underwent irrigation and debridement by orthopedics. 8/22>> [facility]  Significant studies: 8/20>> CT right humerus: Large enhancing abscess with gas-involving deltoid/biceps muscles.  Significant microbiology data: 8/20>> COVID/influenza PCR: Negative 8/20>> blood culture: No growth 8/21>> intraoperative cultures-right arm: Streptococcus constellatus, few actinomyces-microbiology lab holding for possible anaerobe. 8/23>> intraoperative culture-right arm: Negative  Procedures: 8/21>> irrigation[facility]brid[facility]right [facility]. 8/23>> irrigation/debridement of right arm  Consults: Orthopedics. Infectious disease  Subjective: Lying comfortably in bed.  No major issues overnight.  Objective: Vitals: Blood pressure 121/75, pulse 73, temperature 97.8 F (36.6 C), temperature source Oral, resp. rate 16, height 5\' 10"  (1.778 m), weight 61.2 kg, SpO2 97 %.   Exam: Gen Exam:Alert awake-not in any distress HEENT:atraumatic, normocephalic Chest: B/L clear to auscultation anteriorly CVS:S1S2 regular Abdomen:soft non tender, non distended Extremities:no edema Neurology: Non focal Skin: no rash   Pertinent  Labs/Radiology:    Latest Ref Rng & Units 07/26/2022    4:29 AM 07/23/2022    8:34 PM 07/23/2022   10:16 AM  CBC  WBC 4.0 - 10.5 K/uL 15.8  24.5  26.5   Hemoglobin 13.0 - 17.0 g/dL 9.5  09.811.2  9.4   Hematocrit 39.0 - 52.0 % 28.9  35.3  28.4   Platelets 150 - 400 K/uL 433  501  322     Lab Results  Component Value Date   NA 141 07/24/2022   K 3.6 07/24/2022   CL 106 07/24/2022   CO2 29 07/24/2022      Assessment/Plan: Severe sepsis with necrotizing soft tissue infection involving right arm with abscess formation: Afebrile-leukocytosis downtrending-awaiting final culture data (lab holding intraoperative culture on 8/21 for possible anaerobes).  ID following-now on Unasyn-we will need a prolonged course of antibiotics given actinomyces on cultures.  Mild swelling in the right mid forearm: Follow-does not appear to be infected-orthopedics aware.  Tobacco/cocaine/marijuana use: Counseled-denies IVDA.  BMI: Estimated body mass index is 19.37 kg/m as calculated from the following:   Height as of this encounter: 5\' 10"  (1.778 m).   Weight as of this encounter: 61.2 kg.   Code status:   Code Status: Full Code   DVT Prophylaxis: SCDs Start: 07/25/22 1213 SCDs Start: 07/23/22 1927   Family Communication: None at bedside today.   Disposition Plan: Status is: Inpatient Remains inpatient appropriate because: Necrotizing infection of right arm-s/p I&D x3-overall improved-awaiting final cultures from 8/21 to be speciated before consideration of discharge.   Planned Discharge Destination:Home   Diet: Diet Order             Diet regular Room service appropriate? Yes; Fluid consistency: Thin  Diet effective now  Antimicrobial agents: Anti-infectives (From admission, onward)    Start     Dose/Rate Route Frequency Ordered Stop   07/25/22 1000  ceFAZolin (ANCEF) IVPB 2g/100 mL premix        2 g 200 mL/hr over 30 Minutes Intravenous On call to O.R.  07/25/22 0947 07/25/22 1054   07/25/22 0936  ceFAZolin (ANCEF) 2-4 GM/100ML-% IVPB       Note to Pharmacy: Shanda Bumps M: cabinet override      07/25/22 0936 07/25/22 1053   07/24/22 1600  Ampicillin-Sulbactam (UNASYN) 3 g in sodium chloride 0.9 % 100 mL IVPB        3 g 200 mL/hr over 30 Minutes Intravenous Every 6 hours 07/24/22 1427     07/23/22 1430  ceFAZolin (ANCEF) IVPB 2g/100 mL premix        2 g 200 mL/hr over 30 Minutes Intravenous On call to O.R. 07/22/22 1910 07/23/22 1737   07/22/22 2200  linezolid (ZYVOX) IVPB 600 mg        600 mg 300 mL/hr over 60 Minutes Intravenous Every 12 hours 07/22/22 1354 07/25/22 2359   07/22/22 1445  piperacillin-tazobactam (ZOSYN) IVPB 3.375 g  Status:  Discontinued        3.375 g 12.5 mL/hr over 240 Minutes Intravenous Every 8 hours 07/22/22 1353 07/24/22 1427   07/21/22 2200  vancomycin (VANCOREADY) IVPB 750 mg/150 mL  Status:  Discontinued       See Hyperspace for full Linked Orders Report.   750 mg 150 mL/hr over 60 Minutes Intravenous Every 12 hours 07/21/22 1457 07/22/22 1354   07/21/22 1458  cefTRIAXone (ROCEPHIN) 2 g in sodium chloride 0.9 % 100 mL IVPB  Status:  Discontinued        2 g 200 mL/hr over 30 Minutes Intravenous Daily 07/21/22 1458 07/22/22 0742   07/21/22 0615  vancomycin (VANCOREADY) IVPB 750 mg/150 mL  Status:  Discontinued       See Hyperspace for full Linked Orders Report.   750 mg 150 mL/hr over 60 Minutes Intravenous Every 12 hours 07/20/22 1805 07/21/22 1457   07/20/22 1815  vancomycin (VANCOREADY) IVPB 1250 mg/250 mL       See Hyperspace for full Linked Orders Report.   1,250 mg 166.7 mL/hr over 90 Minutes Intravenous  Once 07/20/22 1805 07/20/22 2015   07/20/22 1800  metroNIDAZOLE (FLAGYL) IVPB 500 mg  Status:  Discontinued        500 mg 100 mL/hr over 60 Minutes Intravenous Every 12 hours 07/20/22 1749 07/22/22 1353   07/20/22 1615  cefTRIAXone (ROCEPHIN) 2 g in sodium chloride 0.9 % 100 mL IVPB  Status:   Discontinued        2 g 200 mL/hr over 30 Minutes Intravenous Every 24 hours 07/20/22 1613 07/21/22 1458        MEDICATIONS: Scheduled Meds:  acetaminophen  1,000 mg Oral TID   docusate sodium  100 mg Oral BID   nicotine  14 mg Transdermal Daily   Continuous Infusions:  sodium chloride     sodium chloride     ampicillin-sulbactam (UNASYN) IV 3 g (07/26/22 0939)   methocarbamol (ROBAXIN) IV     PRN Meds:.acetaminophen, albuterol, bisacodyl, HYDROmorphone (DILAUDID) injection, melatonin, methocarbamol **OR** methocarbamol (ROBAXIN) IV, metoCLOPramide **OR** metoCLOPramide (REGLAN) injection, morphine injection, ondansetron **OR** ondansetron (ZOFRAN) IV, oxyCODONE, oxyCODONE, oxyCODONE, polyethylene glycol, simethicone   I have personally reviewed following labs and imaging studies  LABORATORY DATA: CBC: Recent Labs  Lab 07/20/22 1633 07/21/22 0533  07/22/22 0544 07/23/22 0348 07/23/22 1016 07/23/22 2034 07/26/22 0429  WBC 22.1*   < > 29.2* 31.6* 26.5* 24.5* 15.8*  NEUTROABS 15.7*  --   --   --  22.8* 17.6*  --   HGB 13.4   < > 10.4* 9.8* 9.4* 11.2* 9.5*  HCT 41.2   < > 32.7* 29.5* 28.4* 35.3* 28.9*  MCV 82.6   < > 84.3 81.5 82.1 83.5 81.6  PLT 346   < > 320 338 322 501* 433*   < > = values in this interval not displayed.     Basic Metabolic Panel: Recent Labs  Lab 07/20/22 1633 07/21/22 0533 07/22/22 0544 07/23/22 0348 07/24/22 0247  NA 130* 138 141 140 141  K 3.8 3.6 3.9 4.1 3.6  CL 91* 107 111 107 106  CO2 30 25 25 24 29   GLUCOSE 122* 111* 159* 140* 111*  BUN 13 14 16 18  23*  CREATININE 0.97 0.96 0.73 0.75 1.09  CALCIUM 9.1 8.3* 8.8* 8.7* 8.2*     GFR: Estimated Creatinine Clearance: 74.9 mL/min (by C-G formula based on SCr of 1.09 mg/dL).  Liver Function Tests: Recent Labs  Lab 07/20/22 1633 07/24/22 0247  AST 26 53*  ALT 13 24  ALKPHOS 64 49  BILITOT 1.1 0.2*  PROT 7.2 4.9*  ALBUMIN 3.2* 2.3*    No results for input(s): "LIPASE",  "AMYLASE" in the last 168 hours. No results for input(s): "AMMONIA" in the last 168 hours.  Coagulation Profile: Recent Labs  Lab 07/20/22 1633  INR 1.2     Cardiac Enzymes: No results for input(s): "CKTOTAL", "CKMB", "CKMBINDEX", "TROPONINI" in the last 168 hours.  BNP (last 3 results) No results for input(s): "PROBNP" in the last 8760 hours.  Lipid Profile: No results for input(s): "CHOL", "HDL", "LDLCALC", "TRIG", "CHOLHDL", "LDLDIRECT" in the last 72 hours.  Thyroid Function Tests: No results for input(s): "TSH", "T4TOTAL", "FREET4", "T3FREE", "THYROIDAB" in the last 72 hours.  Anemia Panel: No results for input(s): "VITAMINB12", "FOLATE", "FERRITIN", "TIBC", "IRON", "RETICCTPCT" in the last 72 hours.  Urine analysis:    Component Value Date/Time   COLORURINE YELLOW 04/06/2011 0223   APPEARANCEUR CLEAR 04/06/2011 0223   LABSPEC <1.005 (L) 04/06/2011 0223   PHURINE 5.5 04/06/2011 0223   GLUCOSEU NEGATIVE 04/06/2011 0223   HGBUR NEGATIVE 04/06/2011 0223   BILIRUBINUR NEGATIVE 04/06/2011 0223   KETONESUR NEGATIVE 04/06/2011 0223   PROTEINUR NEGATIVE 04/06/2011 0223   UROBILINOGEN 0.2 04/06/2011 0223   NITRITE NEGATIVE 04/06/2011 0223   LEUKOCYTESUR  04/06/2011 0223    NEGATIVE MICROSCOPIC NOT DONE ON URINES WITH NEGATIVE PROTEIN, BLOOD, LEUKOCYTES, NITRITE, OR GLUCOSE <1000 mg/dL.    Sepsis Labs: Lactic Acid, Venous    Component Value Date/Time   LATICACIDVEN 1.1 07/20/2022 1856    MICROBIOLOGY: Recent Results (from the past 240 hour(s))  Resp Panel by RT-PCR (Flu A&B, Covid) Anterior Nasal Swab     Status: None   Collection Time: 07/20/22  4:13 PM   Specimen: Anterior Nasal Swab  Result Value Ref Range Status   SARS Coronavirus 2 by RT PCR NEGATIVE NEGATIVE Final    Comment: (NOTE) SARS-CoV-2 target nucleic acids are NOT DETECTED.  The SARS-CoV-2 RNA is generally detectable in upper respiratory specimens during the acute phase of infection. The  lowest concentration of SARS-CoV-2 viral copies this assay can detect is 138 copies/mL. A negative result does not preclude SARS-Cov-2 infection and should not be used as the sole basis for treatment or other  patient management decisions. A negative result may occur with  improper specimen collection/handling, submission of specimen other than nasopharyngeal swab, presence of viral mutation(s) within the areas targeted by this assay, and inadequate number of viral copies(<138 copies/mL). A negative result must be combined with clinical observations, patient history, and epidemiological information. The expected result is Negative.  Fact Sheet for Patients:  BloggerCourse.com  Fact Sheet for Healthcare Providers:  SeriousBroker.it  This test is no t yet approved or cleared by the [place] FDA and  has been authorized for detection and/or diagnosis of SARS-CoV-2 by FDA under an Emergency Use Authorization (EUA). This EUA will remain  in effect (meaning this test can be used) for the duration of the COVID-19 declaration under Section 564(b)(1) of the Act, 21 U.S.C.section 360bbb-3(b)(1), unless the authorization is terminated  or revoked sooner.       Influenza A by PCR NEGATIVE NEGATIVE Final   Influenza B by PCR NEGATIVE NEGATIVE Final    Comment: (NOTE) The Xpert Xpress SARS-CoV-2/FLU/RSV plus assay is intended as an aid in the diagnosis of influenza from Nasopharyngeal swab specimens and should not be used as a sole basis for treatment. Nasal washings and aspirates are unacceptable for Xpert Xpress SARS-CoV-2/FLU/RSV testing.  Fact Sheet for Patients: BloggerCourse.com  Fact Sheet for Healthcare Providers: SeriousBroker.it  This test is not yet approved or cleared by the [place] FDA and has been authorized for detection and/or diagnosis of SARS-CoV-2 by FDA under  an Emergency Use Authorization (EUA). This EUA will remain in effect (meaning this test can be used) for the duration of the COVID-19 declaration under Section 564(b)(1) of the Act, 21 U.S.C. section 360bbb-3(b)(1), unless the authorization is terminated or revoked.  Performed at [facility], [address] [place], [place] [address]   Blood Culture (routine x 2)     Status: None   Collection Time: 07/20/22  4:18 PM   Specimen: BLOOD LEFT FOREARM  Result Value Ref Range Status   Specimen Description   Final    BLOOD LEFT FOREARM BOTTLES DRAWN AEROBIC AND ANAEROBIC   Special Requests Blood Culture adequate volume  Final   Culture   Final    NO GROWTH 5 DAYS Performed at [facility], [address], [place], [place] [address]    Report Status 07/25/2022 FINAL  Final  Blood Culture (routine x 2)     Status: None   Collection Time: 07/20/22  4:33 PM   Specimen: Left Antecubital; Blood  Result Value Ref Range Status   Specimen Description   Final    LEFT ANTECUBITAL BOTTLES DRAWN AEROBIC AND ANAEROBIC   Special Requests Blood Culture adequate volume  Final   Culture   Final    NO GROWTH 5 DAYS Performed at [facility], [address], [place], [place] [address]    Report Status 07/25/2022 FINAL  Final  MRSA Next Gen by PCR, Nasal     Status: None   Collection Time: 07/21/22 12:54 AM   Specimen: Nasal Mucosa; Nasal Swab  Result Value Ref Range Status   MRSA by PCR Next Gen NOT DETECTED NOT DETECTED Final    Comment: (NOTE) The GeneXpert MRSA Assay (FDA approved for NASAL specimens only), is one component of a comprehensive MRSA colonization surveillance program. It is not intended to diagnose MRSA infection nor to guide or monitor treatment for MRSA infections. Test performance is not FDA approved in patients less than 74 years old. Performed at [facility], [address] [place], [place] [address]   Fungus Culture With Stain     Status: None  (Preliminary result)   Collection Time: 07/21/22 10:23 AM   Specimen: PATH Other; Tissue  Result Value Ref Range Status   Fungus Stain Final report  Final    Comment: (NOTE) Performed At: [facility] [address] [place], [place] [id number] Jolene Schimke MD FA:[id number]    Fungus (Mycology) Culture PENDING  Incomplete   Fungal Source ABSCESS  Final    Comment: Performed at [facility], [address] [place], [place] [address]  Aerobic/Anaerobic Culture w Gram Stain (surgical/deep wound)     Status: None (Preliminary result)   Collection Time: 07/21/22 10:23 AM   Specimen: PATH Other; Tissue  Result Value Ref Range Status   Specimen Description ABSCESS  Final   Special Requests  RIGHT UPPER ARM  Final   Gram Stain   Final    MODERATE WBC PRESENT,BOTH PMN AND MONONUCLEAR MODERATE GRAM NEGATIVE RODS MODERATE GRAM POSITIVE COCCI IN PAIRS AND CHAINS FEW GRAM POSITIVE RODS    Culture   Final    MODERATE STREPTOCOCCUS CONSTELLATUS FEW ACTINOMYCES ODONTOLYTICUS HOLDING FOR POSSIBLE ANAEROBE CULTURE REINCUBATED FOR BETTER GROWTH Performed at [facility], [address], [place], [place] [address]    Report Status PENDING  Incomplete  Acid Fast Smear (AFB)     Status: None   Collection Time: 07/21/22 10:23 AM   Specimen: PATH Other; Tissue  Result Value Ref Range Status   AFB Specimen Processing Concentration  Final   Acid Fast Smear Negative  Final    Comment: (NOTE) Performed At: [facility] [address] [place], [place] [id number] Jolene Schimke MD OZ:[id number]    Source (AFB) ABSCESS  Final    Comment: Performed at [facility], [address], [place], [place] [address]  Fungus Culture Result     Status: None   Collection Time: 07/21/22 10:23 AM  Result Value Ref Range Status   Result 1 Comment  Final    Comment: (NOTE) KOH/Calcofluor preparation:  no fungus observed. Performed At: [facility] [address]  [place], [place] [id number] Jolene Schimke MD RJ:[id number]   Aerobic/Anaerobic Culture w Gram Stain (surgical/deep wound)     Status: None (Preliminary result)   Collection Time: 07/23/22  5:44 PM   Specimen: Soft Tissue, Other  Result Value Ref Range Status   Specimen Description TISSUE  Final   Special Requests RIGHT ARM FASCIA  Final   Gram Stain NO ORGANISMS SEEN NO WBC SEEN   Final   Culture   Final    NO GROWTH 3 DAYS NO ANAEROBES ISOLATED; CULTURE IN PROGRESS FOR 5 DAYS Performed at [facility], [address], [place], [place] [address]    Report Status PENDING  Incomplete  Acid Fast Smear (AFB)     Status: None   Collection Time: 07/23/22  5:44 PM   Specimen: Soft Tissue, Other  Result Value Ref Range Status   AFB Specimen Processing Concentration  Final   Acid Fast Smear Negative  Final    Comment: (NOTE) Performed At: [facility] [address] [place], [place] [id number] Jolene Schimke MD UK:[id number]    Source (AFB) TISSUE  Final    Comment: Performed at [facility], [address], [place], [place] [address]    RADIOLOGY STUDIES/RESULTS: No results found.   LOS: 6 days   Jeoffrey Massed, MD  [facility]    To contact the attending provider between 7A-7P or the covering provider during after hours 7P-7A, please log into the web site [email or web address] and access using universal [place] password for that web site. If you do not have the password, please call the hospital operator.  07/26/2022, 10:34 AM

## 2022-07-26 NOTE — Plan of Care (Signed)
  Problem: Education: Goal: Knowledge of General Education information will improve Description: Including pain rating scale, medication(s)/side effects and non-pharmacologic comfort measures 07/26/2022 1019 by Leory Plowman, RN Outcome: Progressing 07/26/2022 1019 by Leory Plowman, RN Outcome: Progressing   Problem: Health Behavior/Discharge Planning: Goal: Ability to manage health-related needs will improve 07/26/2022 1019 by Leory Plowman, RN Outcome: Progressing 07/26/2022 1019 by Leory Plowman, RN Outcome: Progressing   Problem: Clinical Measurements: Goal: Ability to maintain clinical measurements within normal limits will improve 07/26/2022 1019 by Leory Plowman, RN Outcome: Progressing 07/26/2022 1019 by Leory Plowman, RN Outcome: Progressing Goal: Will remain free from infection 07/26/2022 1019 by Leory Plowman, RN Outcome: Progressing 07/26/2022 1019 by Leory Plowman, RN Outcome: Progressing Goal: Diagnostic test results will improve 07/26/2022 1019 by Leory Plowman, RN Outcome: Progressing 07/26/2022 1019 by Leory Plowman, RN Outcome: Progressing Goal: Respiratory complications will improve 07/26/2022 1019 by Leory Plowman, RN Outcome: Progressing 07/26/2022 1019 by Leory Plowman, RN Outcome: Progressing Goal: Cardiovascular complication will be avoided 07/26/2022 1019 by Leory Plowman, RN Outcome: Progressing 07/26/2022 1019 by Leory Plowman, RN Outcome: Progressing   Problem: Nutrition: Goal: Adequate nutrition will be maintained 07/26/2022 1019 by Leory Plowman, RN Outcome: Progressing 07/26/2022 1019 by Leory Plowman, RN Outcome: Progressing   Problem: Activity: Goal: Risk for activity intolerance will decrease 07/26/2022 1019 by Leory Plowman, RN Outcome: Progressing 07/26/2022 1019 by Leory Plowman, RN Outcome: Progressing   Problem: Elimination: Goal: Will not experience complications related to bowel  motility 07/26/2022 1019 by Leory Plowman, RN Outcome: Progressing 07/26/2022 1019 by Leory Plowman, RN Outcome: Progressing Goal: Will not experience complications related to urinary retention Outcome: Progressing   Problem: Pain Managment: Goal: General experience of comfort will improve Outcome: Progressing   Problem: Safety: Goal: Ability to remain free from injury will improve Outcome: Progressing   Problem: Skin Integrity: Goal: Risk for impaired skin integrity will decrease Outcome: Progressing

## 2022-07-27 ENCOUNTER — Inpatient Hospital Stay (HOSPITAL_COMMUNITY): Payer: Self-pay

## 2022-07-27 DIAGNOSIS — A419 Sepsis, unspecified organism: Secondary | ICD-10-CM | POA: Diagnosis not present

## 2022-07-27 DIAGNOSIS — R6520 Severe sepsis without septic shock: Secondary | ICD-10-CM | POA: Diagnosis not present

## 2022-07-27 LAB — CBC WITH DIFFERENTIAL/PLATELET
Abs Immature Granulocytes: 0.2 10*3/uL — ABNORMAL HIGH (ref 0.00–0.07)
Basophils Absolute: 0 10*3/uL (ref 0.0–0.1)
Basophils Relative: 0 %
Eosinophils Absolute: 0.7 10*3/uL — ABNORMAL HIGH (ref 0.0–0.5)
Eosinophils Relative: 4 %
HCT: 32.1 % — ABNORMAL LOW (ref 39.0–52.0)
Hemoglobin: 10 g/dL — ABNORMAL LOW (ref 13.0–17.0)
Lymphocytes Relative: 44 %
Lymphs Abs: 7.3 10*3/uL — ABNORMAL HIGH (ref 0.7–4.0)
MCH: 26.8 pg (ref 26.0–34.0)
MCHC: 31.2 g/dL (ref 30.0–36.0)
MCV: 86.1 fL (ref 80.0–100.0)
Monocytes Absolute: 1.3 10*3/uL — ABNORMAL HIGH (ref 0.1–1.0)
Monocytes Relative: 8 %
Myelocytes: 1 %
Neutro Abs: 7.1 10*3/uL (ref 1.7–7.7)
Neutrophils Relative %: 43 %
Platelets: 529 10*3/uL — ABNORMAL HIGH (ref 150–400)
RBC: 3.73 MIL/uL — ABNORMAL LOW (ref 4.22–5.81)
RDW: 17 % — ABNORMAL HIGH (ref 11.5–15.5)
WBC: 16.6 10*3/uL — ABNORMAL HIGH (ref 4.0–10.5)
nRBC: 0 % (ref 0.0–0.2)
nRBC: 0 /100 WBC

## 2022-07-27 LAB — COMPREHENSIVE METABOLIC PANEL
ALT: 19 U/L (ref 0–44)
AST: 23 U/L (ref 15–41)
Albumin: 2.8 g/dL — ABNORMAL LOW (ref 3.5–5.0)
Alkaline Phosphatase: 47 U/L (ref 38–126)
Anion gap: 6 (ref 5–15)
BUN: 13 mg/dL (ref 6–20)
CO2: 30 mmol/L (ref 22–32)
Calcium: 8.6 mg/dL — ABNORMAL LOW (ref 8.9–10.3)
Chloride: 107 mmol/L (ref 98–111)
Creatinine, Ser: 0.94 mg/dL (ref 0.61–1.24)
GFR, Estimated: >60 mL/min (ref >60)
Glucose, Bld: 85 mg/dL (ref 70–99)
Potassium: 4.2 mmol/L (ref 3.5–5.1)
Sodium: 143 mmol/L (ref 135–145)
Total Bilirubin: 0.3 mg/dL (ref 0.3–1.2)
Total Protein: 6 g/dL — ABNORMAL LOW (ref 6.5–8.1)

## 2022-07-27 LAB — MAGNESIUM: Magnesium: 2.1 mg/dL (ref 1.7–2.4)

## 2022-07-27 MED ORDER — IOHEXOL 300 MG/ML  SOLN
80.0000 mL | Freq: Once | INTRAMUSCULAR | Status: AC | PRN
  Administered 2022-07-27: 80 mL via INTRAVENOUS

## 2022-07-27 MED ORDER — ENOXAPARIN SODIUM 40 MG/0.4ML IJ SOSY
40.0000 mg | PREFILLED_SYRINGE | INTRAMUSCULAR | Status: DC
  Administered 2022-07-27 – 2022-08-01 (×5): 40 mg via SUBCUTANEOUS
  Filled 2022-07-27 (×5): qty 0.4

## 2022-07-27 NOTE — Progress Notes (Signed)
ANTICOAGULATION CONSULT NOTE - Initial Consult  Pharmacy Consult for Lovenox  Indication: VTE prophylaxis  Allergies  Allergen Reactions   Hydrocodone Itching   Patient Measurements: Height: 5\' 10"  (177.8 cm) Weight: 61.2 kg (135 lb) IBW/kg (Calculated) : 73 kg  Vital Signs: Temp: 98.3 F (36.8 C) (08/27 0754) Temp Source: Oral (08/27 0754) BP: 160/99 (08/27 0754) Pulse Rate: 57 (08/27 0754)  Labs: Recent Labs    07/26/22 0429 07/27/22 0149  HGB 9.5* 10.0*  HCT 28.9* 32.1*  PLT 433* 529*  CREATININE  --  0.94   Estimated Creatinine Clearance: 86.8 mL/min (by C-G formula based on SCr of 0.94 mg/dL).  Medical History: Past Medical History:  Diagnosis Date   Asthma    Medications:  Scheduled:   acetaminophen  1,000 mg Oral TID   docusate sodium  100 mg Oral BID   enoxaparin (LOVENOX) injection  40 mg Subcutaneous Q24H   nicotine  14 mg Transdermal Daily   Assessment: James Baker is a 46 year old male admitted for right arm infection. Pharmacy consulted to dose Lovenox for VTE prophylaxis. Hgb 10 and stable, plt 529.   Goal of Therapy:   Monitor platelets by anticoagulation protocol: Yes   Plan:  Start Lovenox 40 mg subcutaneous daily.  Monitor for signs and symptoms of bleeding.  61, Pharm.D PGY1 Pharmacy Resident 07/27/2022 10:19 AM

## 2022-07-27 NOTE — Progress Notes (Addendum)
PROGRESS NOTE        PATIENT DETAILS Name: James Baker Age: 46 y.o. Sex: male Date of Birth: 1976-01-23 Admit Date: 07/20/2022 Admitting Physician Onnie Boer, MD [id number], No Pcp Per  Brief Summary: Patient is a 46 y.o.  male with history of tobacco, occasional cocaine/marijuana use-who gave himself a tattoo in the right forearm-presented with yellow/greenish discharge surrounding the tattoo area-subsequent pain and swelling of his right arm.  He was found to have necrotizing tissue infection with abscess formation-and subsequently admitted to the [facility] service.  See below for further details.   Significant events: 8/20>> admit to [facility] at [facility]-right arm necrotizing infection/abscess formation. 8/21>> transfer to [facility] irrigation and debridement by orthopedics. 8/22>> transfer to [facility]  Significant studies: 8/20>> CT right humerus: Large enhancing abscess with gas-involving deltoid/biceps muscles.  Significant microbiology data: 8/20>> COVID/influenza PCR: Negative 8/20>> blood culture: No growth 8/21>> intraoperative cultures-right arm: Streptococcus constellatus, few actinomyces-microbiology lab holding for possible anaerobe. 8/23>> intraoperative culture-right arm: Negative  Procedures: 8/21>> irrigation/debridement-right arm-by orthopedics at [facility]. 8/23>> irrigation/debridement of right arm  Consults: Orthopedics. Infectious disease  Subjective:  Patient in bed, appears comfortable, denies any headache, no fever, no chest pain or pressure, no shortness of breath , no abdominal pain. No focal weakness.  Objective: Vitals: Blood pressure (!) 160/99, pulse (!) 57, temperature 98.3 F (36.8 C), temperature source Oral, resp. rate 16, height 5\' 10"  (1.778 m), weight 61.2 kg, SpO2 99 %.   Exam:  Awake Alert, No new F.N deficits, Normal affect [place].AT,PERRAL Supple Neck, No JVD,   Symmetrical Chest wall movement, Good air movement  bilaterally, CTAB RRR,No Gallops, Rubs or new Murmurs,  +ve B.Sounds, Abd Soft, No tenderness,   No Cyanosis, right arm with wound VAC, mid forearm swelling   Assessment/Plan:  Severe sepsis with necrotizing soft tissue infection involving right arm with abscess formation: Afebrile-seen by orthopedics and ID, underwent multiple I&D procedures, currently has right arm wound VAC, on Unasyn per ID.  Follow final incision and drainage culture and sensitivity data.  Antibiotics per ID.  Mild swelling in the right mid forearm: CT obtained, DW Dr , repeat abscess, I&D in 2-3 days.  Tobacco/cocaine/marijuana use: Counseled-denies IVDA.  BMI: Estimated body mass index is 19.37 kg/m as calculated from the following:   Height as of this encounter: 5\' 10"  (1.778 m).   Weight as of this encounter: 61.2 kg.   Code status:   Code Status: Full Code   DVT Prophylaxis: Start on Lovenox daily SCDs Start: 07/25/22 1213 SCDs Start: 07/23/22 1927   Family Communication: None at bedside today.   Disposition Plan: Status is: Inpatient Remains inpatient appropriate because: Necrotizing infection of right arm-s/p I&D x3-overall improved-awaiting final cultures from 8/21 to be speciated before consideration of discharge.   Planned Discharge Destination:Home   Diet: Diet Order             Diet regular Room service appropriate? Yes; Fluid consistency: Thin  Diet effective now                   MEDICATIONS: Scheduled Meds:  acetaminophen  1,000 mg Oral TID   docusate sodium  100 mg Oral BID   nicotine  14 mg Transdermal Daily   Continuous Infusions:  sodium chloride     sodium chloride     ampicillin-sulbactam (UNASYN) IV  3 g (07/27/22 0928)   methocarbamol (ROBAXIN) IV     PRN Meds:.acetaminophen, albuterol, bisacodyl, HYDROmorphone (DILAUDID) injection, melatonin, methocarbamol **OR** methocarbamol (ROBAXIN) IV, metoCLOPramide **OR** metoCLOPramide (REGLAN) injection, morphine  injection, ondansetron **OR** ondansetron (ZOFRAN) IV, oxyCODONE, oxyCODONE, oxyCODONE, polyethylene glycol, simethicone   I have personally reviewed following labs and imaging studies  LABORATORY DATA:  Recent Labs  Lab 07/20/22 1633 07/21/22 0533 07/23/22 0348 07/23/22 1016 07/23/22 2034 07/26/22 0429 07/27/22 0149  WBC 22.1*   < > 31.6* 26.5* 24.5* 15.8* 16.6*  HGB 13.4   < > 9.8* 9.4* 11.2* 9.5* 10.0*  HCT 41.2   < > 29.5* 28.4* 35.3* 28.9* 32.1*  PLT 346   < > 338 322 501* 433* 529*  MCV 82.6   < > 81.5 82.1 83.5 81.6 86.1  MCH 26.9   < > 27.1 27.2 26.5 26.8 26.8  MCHC 32.5   < > 33.2 33.1 31.7 32.9 31.2  RDW 15.7*   < > 16.1* 16.2* 16.5* 16.2* 17.0*  LYMPHSABS 3.7  --   --  3.4 5.6*  --  7.3*  MONOABS 2.1*  --   --  0.3 0.9  --  1.3*  EOSABS 0.2  --   --  0.0 0.1  --  0.7*  BASOSABS 0.1  --   --  0.0 0.1  --  0.0   < > = values in this interval not displayed.    Recent Labs  Lab 07/20/22 1633 07/20/22 1856 07/21/22 0533 07/22/22 0544 07/23/22 0348 07/24/22 0247 07/27/22 0149  NA 130*  --  138 141 140 141 143  K 3.8  --  3.6 3.9 4.1 3.6 4.2  CL 91*  --  107 111 107 106 107  CO2 30  --  25 25 24 29 30   GLUCOSE 122*  --  111* 159* 140* 111* 85  BUN 13  --  14 16 18  23* 13  CREATININE 0.97  --  0.96 0.73 0.75 1.09 0.94  CALCIUM 9.1  --  8.3* 8.8* 8.7* 8.2* 8.6*  AST 26  --   --   --   --  53* 23  ALT 13  --   --   --   --  24 19  ALKPHOS 64  --   --   --   --  49 47  BILITOT 1.1  --   --   --   --  0.2* 0.3  ALBUMIN 3.2*  --   --   --   --  2.3* 2.8*  MG  --   --   --   --   --   --  2.1  CRP  --   --   --   --   --  7.3*  --   LATICACIDVEN 2.0* 1.1  --   --   --   --   --   INR 1.2  --   --   --   --   --   --    RADIOLOGY STUDIES/RESULTS: No results found.   LOS: 7 days   Signature  M.D on 07/27/2022 at 10:02 AM   -  To page go to [email or web address]

## 2022-07-27 NOTE — Progress Notes (Signed)
Physical Therapy Note  Followed up with patient regarding new PT order. Seen previously by my colleague this week. Patient reports no functional change, that he is still mobilizing well and independently without any issues or concerns. Declines physical therapy services. All questions answered. PT will sign-off at this time. Please re-order for any significant change in functional status.  Kathlyn Sacramento, PT, DPT Physical Therapist Acute Rehabilitation Services [facility] Outpatient Rehabilitation Services [facility]

## 2022-07-27 NOTE — Progress Notes (Signed)
Spoke with Dr. Thedore Mins and made MD aware that patient is concerned about lumpy swollen area on his right forearm. MD stated that he has assessed it already as well as Dr. Lajoyce Corners.  MD stated he will order a CT scan.

## 2022-07-27 NOTE — Evaluation (Signed)
Occupational Therapy Evaluation Patient Details Name: James Baker MRN: [id number] DOB: 08-Apr-1976 Today's Date: 07/27/2022   History of Present Illness Patient is a 46 y.o.  male who gave himself a tattoo in the right forearm-presented with yellow/greenish discharge surrounding the tattoo area-subsequent pain and swelling of his right arm.  He was found to have necrotizing tissue infection with abscess formation. Pt with history of tobacco, occasional cocaine/marijuana use   Clinical Impression   Pt seen s/p the above admission list, he is indep at baseline. Upon evaluation pt demonstrated indep ability to complete ADLs after review of compensatory techniques and management of would vac. Educated pt on bathing restrictions due to wound vac and sling wear/care, he verbalized understanding. Pt does not have further acute OT needs. Recommend d/c to home with assist from family.      Recommendations for follow up therapy are one component of a multi-disciplinary discharge planning process, led by the attending physician.  Recommendations may be updated based on patient status, additional functional criteria and insurance authorization.   Follow Up Recommendations  No OT follow up    Assistance Recommended at Discharge None  Patient can return home with the following A little help with walking and/or transfers;A little help with bathing/dressing/bathroom;Assist for transportation;Help with stairs or ramp for entrance;Assistance with cooking/housework    Functional Status Assessment  Patient has had a recent decline in their functional status and demonstrates the ability to make significant improvements in function in a reasonable and predictable amount of time.  Equipment Recommendations  None recommended by OT    Recommendations for Other Services       Precautions / Restrictions Precautions Precautions: Fall (low) Precaution Comments: wound vac Required Braces or Orthoses: Sling  (for comfort) Restrictions Weight Bearing Restrictions: No      Mobility Bed Mobility Overal bed mobility: Independent                  Transfers Overall transfer level: Independent                        Balance Overall balance assessment: Independent                                         ADL either performed or assessed with clinical judgement   ADL Overall ADL's : Needs assistance/impaired                                       General ADL Comments: Pt is globally indep with ADLs - reviewed compensatory techniques for dressing and bathign with wound avc. He verbalize/demonstrated great understanding     Vision Baseline Vision/History: 0 No visual deficits Vision Assessment?: No apparent visual deficits     Perception     Praxis      Pertinent Vitals/Pain Pain Assessment Pain Assessment: Faces Faces Pain Scale: Hurts a little bit Pain Location: R arm Pain Intervention(s): Monitored during session     Hand Dominance Right   Extremity/Trunk Assessment Upper Extremity Assessment Upper Extremity Assessment: RUE deficits/detail RUE Deficits / Details: painful ROM, wound vac. grip strength WFL RUE Sensation: WNL   Lower Extremity Assessment Lower Extremity Assessment: Overall WFL for tasks assessed   Cervical / Trunk Assessment Cervical / Trunk Assessment: Normal  Communication Communication Communication: No difficulties   Cognition Arousal/Alertness: Awake/alert Behavior During Therapy: WFL for tasks assessed/performed Overall Cognitive Status: Within Functional Limits for tasks assessed                                       General Comments  VSS on RA    Exercises     Shoulder Instructions      Home Living Family/patient expects to be discharged to:: Private residence Living Arrangements: Spouse/significant other;Children Available Help at Discharge: Family;Available 24  hours/day Type of Home: Apartment Home Access: Level entry     Home Layout: One level     Bathroom Shower/Tub: Chief Strategy Officer: Standard     Home Equipment: None          Prior Functioning/Environment Prior Level of Function : Independent/Modified Independent;Driving;Working/employed                        OT Problem List: Decreased range of motion;Decreased activity tolerance;Decreased knowledge of precautions;Pain;Impaired UE functional use      OT Treatment/Interventions:      OT Goals(Current goals can be found in the care plan section) Acute Rehab OT Goals Patient Stated Goal: home OT Goal Formulation: With patient Time For Goal Achievement: 08/10/22 Potential to Achieve Goals: Good  OT Frequency:      Co-evaluation              AM-PAC OT "6 Clicks" Daily Activity     Outcome Measure Help from another person eating meals?: None Help from another person taking care of personal grooming?: None Help from another person toileting, which includes using toliet, bedpan, or urinal?: None Help from another person bathing (including washing, rinsing, drying)?: None Help from another person to put on and taking off regular upper body clothing?: None Help from another person to put on and taking off regular lower body clothing?: None 6 Click Score: 24   End of Session Nurse Communication: Mobility status  Activity Tolerance: Patient tolerated treatment well Patient left: in bed;with call bell/phone within reach  OT Visit Diagnosis: Pain                Time: 6384-6659 OT Time Calculation (min): 16 min Charges:  OT General Charges $OT Visit: 1 Visit OT Evaluation $OT Eval Moderate Complexity: 1 Mod    Donnielle Addison A Taeja Debellis 07/27/2022, 1:16 PM

## 2022-07-28 ENCOUNTER — Encounter (HOSPITAL_COMMUNITY): Payer: Self-pay | Admitting: Orthopedic Surgery

## 2022-07-28 DIAGNOSIS — L0390 Cellulitis, unspecified: Secondary | ICD-10-CM | POA: Diagnosis not present

## 2022-07-28 DIAGNOSIS — F1010 Alcohol abuse, uncomplicated: Secondary | ICD-10-CM

## 2022-07-28 DIAGNOSIS — Z720 Tobacco use: Secondary | ICD-10-CM | POA: Diagnosis not present

## 2022-07-28 DIAGNOSIS — A419 Sepsis, unspecified organism: Secondary | ICD-10-CM | POA: Diagnosis not present

## 2022-07-28 DIAGNOSIS — L03113 Cellulitis of right upper limb: Secondary | ICD-10-CM | POA: Diagnosis not present

## 2022-07-28 LAB — COMPREHENSIVE METABOLIC PANEL
ALT: 16 U/L (ref 0–44)
AST: 14 U/L — ABNORMAL LOW (ref 15–41)
Albumin: 2.7 g/dL — ABNORMAL LOW (ref 3.5–5.0)
Alkaline Phosphatase: 42 U/L (ref 38–126)
Anion gap: 5 (ref 5–15)
BUN: 15 mg/dL (ref 6–20)
CO2: 27 mmol/L (ref 22–32)
Calcium: 8.3 mg/dL — ABNORMAL LOW (ref 8.9–10.3)
Chloride: 107 mmol/L (ref 98–111)
Creatinine, Ser: 0.84 mg/dL (ref 0.61–1.24)
GFR, Estimated: >60 mL/min (ref >60)
Glucose, Bld: 101 mg/dL — ABNORMAL HIGH (ref 70–99)
Potassium: 3.7 mmol/L (ref 3.5–5.1)
Sodium: 139 mmol/L (ref 135–145)
Total Bilirubin: 0.3 mg/dL (ref 0.3–1.2)
Total Protein: 5.7 g/dL — ABNORMAL LOW (ref 6.5–8.1)

## 2022-07-28 LAB — AEROBIC/ANAEROBIC CULTURE W GRAM STAIN (SURGICAL/DEEP WOUND)
Culture: NO GROWTH
Gram Stain: NONE SEEN

## 2022-07-28 LAB — CBC WITH DIFFERENTIAL/PLATELET
Abs Immature Granulocytes: 0.38 10*3/uL — ABNORMAL HIGH (ref 0.00–0.07)
Basophils Absolute: 0.1 10*3/uL (ref 0.0–0.1)
Basophils Relative: 1 %
Eosinophils Absolute: 0.7 10*3/uL — ABNORMAL HIGH (ref 0.0–0.5)
Eosinophils Relative: 6 %
HCT: 31.7 % — ABNORMAL LOW (ref 39.0–52.0)
Hemoglobin: 9.8 g/dL — ABNORMAL LOW (ref 13.0–17.0)
Immature Granulocytes: 3 %
Lymphocytes Relative: 46 %
Lymphs Abs: 5.3 10*3/uL — ABNORMAL HIGH (ref 0.7–4.0)
MCH: 26.8 pg (ref 26.0–34.0)
MCHC: 30.9 g/dL (ref 30.0–36.0)
MCV: 86.6 fL (ref 80.0–100.0)
Monocytes Absolute: 0.9 10*3/uL (ref 0.1–1.0)
Monocytes Relative: 8 %
Neutro Abs: 4.2 10*3/uL (ref 1.7–7.7)
Neutrophils Relative %: 36 %
Platelets: 548 10*3/uL — ABNORMAL HIGH (ref 150–400)
RBC: 3.66 MIL/uL — ABNORMAL LOW (ref 4.22–5.81)
RDW: 17.4 % — ABNORMAL HIGH (ref 11.5–15.5)
WBC: 11.6 10*3/uL — ABNORMAL HIGH (ref 4.0–10.5)
nRBC: 0.2 % (ref 0.0–0.2)

## 2022-07-28 LAB — MAGNESIUM: Magnesium: 2.1 mg/dL (ref 1.7–2.4)

## 2022-07-28 NOTE — Progress Notes (Signed)
Subjective: Complaining of pain proximal foream  that will be addressed by Dr. Lajoyce Corners this Wednesday   Antibiotics:  Anti-infectives (From admission, onward)    Start     Dose/Rate Route Frequency Ordered Stop   07/25/22 1000  ceFAZolin (ANCEF) IVPB 2g/100 mL premix        2 g 200 mL/hr over 30 Minutes Intravenous On call to O.R. 07/25/22 0947 07/25/22 1054   07/25/22 0936  ceFAZolin (ANCEF) 2-4 GM/100ML-% IVPB       Note to Pharmacy: Shanda Bumps M: cabinet override      07/25/22 0936 07/25/22 1053   07/24/22 1600  Ampicillin-Sulbactam (UNASYN) 3 g in sodium chloride 0.9 % 100 mL IVPB        3 g 200 mL/hr over 30 Minutes Intravenous Every 6 hours 07/24/22 1427     07/23/22 1430  ceFAZolin (ANCEF) IVPB 2g/100 mL premix        2 g 200 mL/hr over 30 Minutes Intravenous On call to O.R. 07/22/22 1910 07/23/22 1737   07/22/22 2200  linezolid (ZYVOX) IVPB 600 mg        600 mg 300 mL/hr over 60 Minutes Intravenous Every 12 hours 07/22/22 1354 07/25/22 2359   07/22/22 1445  piperacillin-tazobactam (ZOSYN) IVPB 3.375 g  Status:  Discontinued        3.375 g 12.5 mL/hr over 240 Minutes Intravenous Every 8 hours 07/22/22 1353 07/24/22 1427   07/21/22 2200  vancomycin (VANCOREADY) IVPB 750 mg/150 mL  Status:  Discontinued       See Hyperspace for full Linked Orders Report.   750 mg 150 mL/hr over 60 Minutes Intravenous Every 12 hours 07/21/22 1457 07/22/22 1354   07/21/22 1458  cefTRIAXone (ROCEPHIN) 2 g in sodium chloride 0.9 % 100 mL IVPB  Status:  Discontinued        2 g 200 mL/hr over 30 Minutes Intravenous Daily 07/21/22 1458 07/22/22 0742   07/21/22 0615  vancomycin (VANCOREADY) IVPB 750 mg/150 mL  Status:  Discontinued       See Hyperspace for full Linked Orders Report.   750 mg 150 mL/hr over 60 Minutes Intravenous Every 12 hours 07/20/22 1805 07/21/22 1457   07/20/22 1815  vancomycin (VANCOREADY) IVPB 1250 mg/250 mL       See Hyperspace for full Linked Orders Report.    1,250 mg 166.7 mL/hr over 90 Minutes Intravenous  Once 07/20/22 1805 07/20/22 2015   07/20/22 1800  metroNIDAZOLE (FLAGYL) IVPB 500 mg  Status:  Discontinued        500 mg 100 mL/hr over 60 Minutes Intravenous Every 12 hours 07/20/22 1749 07/22/22 1353   07/20/22 1615  cefTRIAXone (ROCEPHIN) 2 g in sodium chloride 0.9 % 100 mL IVPB  Status:  Discontinued        2 g 200 mL/hr over 30 Minutes Intravenous Every 24 hours 07/20/22 1613 07/21/22 1458       Medications: Scheduled Meds:  acetaminophen  1,000 mg Oral TID   docusate sodium  100 mg Oral BID   enoxaparin (LOVENOX) injection  40 mg Subcutaneous Q24H   nicotine  14 mg Transdermal Daily   Continuous Infusions:  sodium chloride     sodium chloride     ampicillin-sulbactam (UNASYN) IV 3 g (07/28/22 1020)   methocarbamol (ROBAXIN) IV     PRN Meds:.acetaminophen, albuterol, bisacodyl, HYDROmorphone (DILAUDID) injection, melatonin, methocarbamol **OR** methocarbamol (ROBAXIN) IV, metoCLOPramide **OR** metoCLOPramide (REGLAN) injection, morphine injection, ondansetron **OR**  ondansetron (ZOFRAN) IV, oxyCODONE, oxyCODONE, oxyCODONE, polyethylene glycol, simethicone    Objective: Weight change:   Intake/Output Summary (Last 24 hours) at 07/28/2022 1111 Last data filed at 07/28/2022 0700 Gross per 24 hour  Intake 540 ml  Output 925 ml  Net -385 ml   Blood pressure 129/85, pulse 64, temperature 98.5 F (36.9 C), temperature source Oral, resp. rate 18, height 5\' 10"  (1.778 m), weight 61.2 kg, SpO2 99 %. Temp:  [98.5 F (36.9 C)] 98.5 F (36.9 C) (08/27 2017) Pulse Rate:  [64-66] 64 (08/27 2017) Resp:  [18] 18 (08/27 2017) BP: (120-129)/(85-92) 129/85 (08/27 2017) SpO2:  [97 %-99 %] 99 % (08/27 2017)  Physical Exam: Physical Exam Constitutional:      Appearance: He is well-developed.  HENT:     Head: Normocephalic and atraumatic.  Eyes:     Conjunctiva/sclera: Conjunctivae normal.  Cardiovascular:     Rate and Rhythm:  Normal rate and regular rhythm.  Pulmonary:     Effort: Pulmonary effort is normal. No respiratory distress.     Breath sounds: Normal breath sounds. No stridor. No wheezing.  Abdominal:     General: There is no distension.     Palpations: Abdomen is soft.  Musculoskeletal:     Cervical back: Normal range of motion and neck supple.  Skin:    General: Skin is warm and dry.     Findings: No erythema or rash.  Neurological:     General: No focal deficit present.     Mental Status: He is alert and oriented to person, place, and time.  Psychiatric:        Mood and Affect: Mood normal.        Behavior: Behavior normal.        Thought Content: Thought content normal.        Judgment: Judgment normal.      With wound vacuum in place and shown an area distal to this area that is tender CBC:    BMET Recent Labs    07/27/22 0149 07/28/22 0441  NA 143 139  K 4.2 3.7  CL 107 107  CO2 30 27  GLUCOSE 85 101*  BUN 13 15  CREATININE 0.94 0.84  CALCIUM 8.6* 8.3*     Liver Panel  Recent Labs    07/27/22 0149 07/28/22 0441  PROT 6.0* 5.7*  ALBUMIN 2.8* 2.7*  AST 23 14*  ALT 19 16  ALKPHOS 47 42  BILITOT 0.3 0.3       Sedimentation Rate No results for input(s): "ESRSEDRATE" in the last 72 hours. C-Reactive Protein No results for input(s): "CRP" in the last 72 hours.  Micro Results: Recent Results (from the past 720 hour(s))  Resp Panel by RT-PCR (Flu A&B, Covid) Anterior Nasal Swab     Status: None   Collection Time: 07/20/22  4:13 PM   Specimen: Anterior Nasal Swab  Result Value Ref Range Status   SARS Coronavirus 2 by RT PCR NEGATIVE NEGATIVE Final    Comment: (NOTE) SARS-CoV-2 target nucleic acids are NOT DETECTED.  The SARS-CoV-2 RNA is generally detectable in upper respiratory specimens during the acute phase of infection. The lowest concentration of SARS-CoV-2 viral copies this assay can detect is 138 copies/mL. A negative result does not preclude  SARS-Cov-2 infection and should not be used as the sole basis for treatment or other patient management decisions. A negative result may occur with  improper specimen collection/handling, submission of specimen other than nasopharyngeal swab, presence of  viral mutation(s) within the areas targeted by this assay, and inadequate number of viral copies(<138 copies/mL). A negative result must be combined with clinical observations, patient history, and epidemiological information. The expected result is Negative.  Fact Sheet for Patients:  BloggerCourse.com  Fact Sheet for Healthcare Providers:  SeriousBroker.it  This test is no t yet approved or cleared by the [place] FDA and  has been authorized for detection and/or diagnosis of SARS-CoV-2 by FDA under an Emergency Use Authorization (EUA). This EUA will remain  in effect (meaning this test can be used) for the duration of the COVID-19 declaration under Section 564(b)(1) of the Act, 21 U.S.C.section 360bbb-3(b)(1), unless the authorization is terminated  or revoked sooner.       Influenza A by PCR NEGATIVE NEGATIVE Final   Influenza B by PCR NEGATIVE NEGATIVE Final    Comment: (NOTE) The Xpert Xpress SARS-CoV-2/FLU/RSV plus assay is intended as an aid in the diagnosis of influenza from Nasopharyngeal swab specimens and should not be used as a sole basis for treatment. Nasal washings and aspirates are unacceptable for Xpert Xpress SARS-CoV-2/FLU/RSV testing.  Fact Sheet for Patients: BloggerCourse.com  Fact Sheet for Healthcare Providers: SeriousBroker.it  This test is not yet approved or cleared by the [place] FDA and has been authorized for detection and/or diagnosis of SARS-CoV-2 by FDA under an Emergency Use Authorization (EUA). This EUA will remain in effect (meaning this test can be used) for the duration of  the COVID-19 declaration under Section 564(b)(1) of the Act, 21 U.S.C. section 360bbb-3(b)(1), unless the authorization is terminated or revoked.  Performed at [facility], [address], [place], [place] [address]   Blood Culture (routine x 2)     Status: None   Collection Time: 07/20/22  4:18 PM   Specimen: BLOOD LEFT FOREARM  Result Value Ref Range Status   Specimen Description   Final    BLOOD LEFT FOREARM BOTTLES DRAWN AEROBIC AND ANAEROBIC   Special Requests Blood Culture adequate volume  Final   Culture   Final    NO GROWTH 5 DAYS Performed at [facility], [address], [place], [place] [address]    Report Status 07/25/2022 FINAL  Final  Blood Culture (routine x 2)     Status: None   Collection Time: 07/20/22  4:33 PM   Specimen: Left Antecubital; Blood  Result Value Ref Range Status   Specimen Description   Final    LEFT ANTECUBITAL BOTTLES DRAWN AEROBIC AND ANAEROBIC   Special Requests Blood Culture adequate volume  Final   Culture   Final    NO GROWTH 5 DAYS Performed at [facility], [address] [place], [place] [address]    Report Status 07/25/2022 FINAL  Final  MRSA Next Gen by PCR, Nasal     Status: None   Collection Time: 07/21/22 12:54 AM   Specimen: Nasal Mucosa; Nasal Swab  Result Value Ref Range Status   MRSA by PCR Next Gen NOT DETECTED NOT DETECTED Final    Comment: (NOTE) The GeneXpert MRSA Assay (FDA approved for NASAL specimens only), is one component of a comprehensive MRSA colonization surveillance program. It is not intended to diagnose MRSA infection nor to guide or monitor treatment for MRSA infections. Test performance is not FDA approved in patients less than 31 years old. Performed at [facility], [address] [place], [place] [address]   Fungus Culture With Stain     Status: None (Preliminary result)  Collection Time: 07/21/22 10:23 AM   Specimen: PATH Other; Tissue  Result Value Ref Range Status   Fungus  Stain Final report  Final    Comment: (NOTE) Performed At: [facility] [address] [place], [place] [id number] Jolene Schimke MD [id number]    Fungus (Mycology) Culture PENDING  Incomplete   Fungal Source ABSCESS  Final    Comment: Performed at [facility], [address] [place], [place] [address]  Aerobic/Anaerobic Culture w Gram Stain (surgical/deep wound)     Status: None (Preliminary result)   Collection Time: 07/21/22 10:23 AM   Specimen: PATH Other; Tissue  Result Value Ref Range Status   Specimen Description ABSCESS  Final   Special Requests  RIGHT UPPER ARM  Final   Gram Stain   Final    MODERATE WBC PRESENT,BOTH PMN AND MONONUCLEAR MODERATE GRAM NEGATIVE RODS MODERATE GRAM POSITIVE COCCI IN PAIRS AND CHAINS FEW GRAM POSITIVE RODS    Culture   Final    MODERATE STREPTOCOCCUS CONSTELLATUS Sent to [facility] for further susceptibility testing. FEW ACTINOMYCES ODONTOLYTICUS Standardized susceptibility testing for this organism is not available. MODERATE BACTEROIDES ORALIS BETA LACTAMASE POSITIVE Performed at [facility], [address] [place], [place] [address]    Report Status PENDING  Incomplete  Acid Fast Smear (AFB)     Status: None   Collection Time: 07/21/22 10:23 AM   Specimen: PATH Other; Tissue  Result Value Ref Range Status   AFB Specimen Processing Concentration  Final   Acid Fast Smear Negative  Final    Comment: (NOTE) Performed At: [facility] [address] [place], [place] [id number] Jolene Schimke MD [id number]    Source (AFB) ABSCESS  Final    Comment: Performed at [facility], [address], [place], [place] [address]  Fungus Culture Result     Status: None   Collection Time: 07/21/22 10:23 AM  Result Value Ref Range Status   Result 1 Comment  Final    Comment: (NOTE) KOH/Calcofluor preparation:  no fungus observed. Performed At: [facility] [address] [place], [place]  [id number] Jolene Schimke MD [id number]   Aerobic/Anaerobic Culture w Gram Stain (surgical/deep wound)     Status: None   Collection Time: 07/23/22  5:44 PM   Specimen: Soft Tissue, Other  Result Value Ref Range Status   Specimen Description TISSUE  Final   Special Requests RIGHT ARM FASCIA  Final   Gram Stain NO ORGANISMS SEEN NO WBC SEEN   Final   Culture   Final    No growth aerobically or anaerobically. Performed at [facility], [address] [place], [place] [address]    Report Status 07/28/2022 FINAL  Final  Acid Fast Smear (AFB)     Status: None   Collection Time: 07/23/22  5:44 PM   Specimen: Soft Tissue, Other  Result Value Ref Range Status   AFB Specimen Processing Concentration  Final   Acid Fast Smear Negative  Final    Comment: (NOTE) Performed At: [facility] [address] [place], [place] [id number] Jolene Schimke MD [id number]    Source (AFB) TISSUE  Final    Comment: Performed at [facility], [address], [place], [place] [address]    Studies/Results: CT FOREARM RIGHT W CONTRAST  Result Date: 07/27/2022 CLINICAL DATA:  Soft tissue infection suspected, upper arm, xray done EXAM: CT OF THE UPPER RIGHT EXTREMITY WITH CONTRAST TECHNIQUE: Multidetector CT imaging of  the upper right extremity was performed according to the standard protocol following intravenous contrast administration. RADIATION DOSE REDUCTION: This exam was performed according to the departmental dose-optimization program which includes automated exposure control, adjustment of the mA and/or kV according to patient size and/or use of iterative reconstruction technique. CONTRAST:  80mL OMNIPAQUE IOHEXOL 300 MG/ML  SOLN COMPARISON:  None Available. FINDINGS: Bones/Joint/Cartilage No acute fracture or dislocation. No erosion or periosteal elevation. Mild arthropathy of the [facility]. No lytic or sclerotic bone lesion. Ligaments Suboptimally assessed by CT. Muscles  and Tendons Rim enhancing intramuscular fluid collection within the superficial aspect of the flexor compartment of the mid right forearm predominantly involving the flexor carpi ulnaris muscle. Collection measures approximately 4.2 x 2.3 x 3.9 cm. No gas within the collection. No additional intramuscular fluid collections are seen within the forearm. Soft tissues Skin thickening and soft tissue edema of the forearm overlying the previously described abscess. No soft tissue gas. IMPRESSION: 1. Intramuscular abscess within the flexor compartment of the mid right forearm predominantly involving the flexor carpi ulnaris muscle measuring up to 4.2 cm with overlying cellulitis. 2. No acute osseous abnormality.  No evidence of osteomyelitis. Electronically Signed   By: Duanne GuessNicholas  Plundo D.O.   On: 07/27/2022 18:01      Assessment/Plan:  INTERVAL HISTORY:   Patient found to have an intramuscular abscess in the flexor component of the mid right forearm involving flexor carpi ulnaris muscle was 4.2 cm dimensions   Principal Problem:   Severe sepsis (HCC) Active Problems:   Necrotizing cellulitis of right arm   Hyponatremia   Tobacco abuse   Cellulitis of right upper extremity    James Baker is a 46 y.o. male with  hx of alcohol abuse, who was apparently performing tattoo on himself and has developed necrotizing fasciitis of his upper extremity with curious constellation of bacteria including Streptococcus constellatus actinomyces and Bacteroides oralis isolated on culture after I and D.   CT of the forearm yesterday shows an additional abscess in the forearm.  #1 necrotizing fasciitis involving the upper arm and tattoo site and now abscess found in the forearm as well  --greatly appreciate Dr. Lajoyce Cornersuda and Dr. Doristine ChurchSingh's vigilance on this patient  VERY curious that the bacteria isolated are all typically oral flora but he denies saliva or spit when he was trying to give himself a tattoo  We will  continue Unasyn and await operative findings and new culture data.  Given that actinomyces was isolated he will need protracted oral therapy with a penicillin  I spent 851 minutes with the patient including than 50% of the time in face to face counseling of the patient necrotizing infection personally reviewing forearm along with review of medical records in preparation for the visit and during the visit and in coordination of his care.    LOS: 8 days   James Baker 07/28/2022, 11:11 AM

## 2022-07-28 NOTE — Progress Notes (Signed)
PROGRESS NOTE        PATIENT DETAILS Name: James Baker Age: 46 y.o. Sex: male Date of Birth: 01/24/1976 Admit Date: 07/20/2022 Admitting Physician Onnie Boer, MD ZOX:[id number], No Pcp Per  Brief Summary: Patient is a 46 y.o.  male with history of tobacco, occasional cocaine/marijuana use-who gave himself a tattoo in the right forearm-presented with yellow/greenish discharge surrounding the tattoo area-subsequent pain and swelling of his right arm.  He was found to have necrotizing tissue infection with abscess formation-and subsequently admitted to the [facility] service.  See below for further details.   Significant events: 8/20>> admit to [facility] at [facility]-right arm necrotizing infection/abscess formation. 8/21>> transfer to [facility] irrigation and debridement by orthopedics. 8/22>> transfer to [facility]  Significant studies: 8/20>> CT right humerus: Large enhancing abscess with gas-involving deltoid/biceps muscles.  Significant microbiology data: 8/20>> COVID/influenza PCR: Negative 8/20>> blood culture: No growth 8/21>> intraoperative cultures-right arm: Streptococcus constellatus, few actinomyces-microbiology lab holding for possible anaerobe. 8/23>> intraoperative culture-right arm: Negative  Procedures: 8/21>> irrigation/debridement-right arm-by orthopedics at [facility]. 8/23>> irrigation/debridement of right arm  Consults: Orthopedics. Infectious disease  Subjective:  Patient in bed, appears comfortable, denies any headache, no fever, no chest pain or pressure, no shortness of breath , no abdominal pain. No new focal weakness.   Objective: Vitals: Blood pressure 129/85, pulse 64, temperature 98.5 F (36.9 C), temperature source Oral, resp. rate 18, height 5\' 10"  (1.778 m), weight 61.2 kg, SpO2 99 %.   Exam:  Awake Alert, No new F.N deficits, Normal affect [place].AT,PERRAL Supple Neck, No JVD,   Symmetrical Chest wall movement, Good air movement  bilaterally, CTAB RRR,No Gallops, Rubs or new Murmurs,  +ve B.Sounds, Abd Soft, No tenderness,   No Cyanosis, right arm with wound VAC, mid forearm swelling   Assessment/Plan:  Severe sepsis with necrotizing soft tissue infection involving right arm with abscess formation: Afebrile-seen by orthopedics and ID, underwent multiple I&D procedures, currently has right arm wound VAC, on Unasyn per ID.  Follow final incision and drainage culture and sensitivity data.  Antibiotics per ID.  Mild swelling in the right mid forearm: CT obtained, DW Dr , repeat abscess, I&D in 2-3 days.  Tobacco/cocaine/marijuana use: Counseled-denies IVDA.  BMI: Estimated body mass index is 19.37 kg/m as calculated from the following:   Height as of this encounter: 5\' 10"  (1.778 m).   Weight as of this encounter: 61.2 kg.   Code status:   Code Status: Full Code   DVT Prophylaxis: Start on Lovenox daily enoxaparin (LOVENOX) injection 40 mg Start: 07/27/22 1100 SCDs Start: 07/25/22 1213 SCDs Start: 07/23/22 1927   Family Communication: None at bedside today.   Disposition Plan: Status is: Inpatient Remains inpatient appropriate because: Necrotizing infection of right arm-s/p I&D x3-overall improved-awaiting final cultures from 8/21 to be speciated before consideration of discharge.   Planned Discharge Destination:Home   Diet: Diet Order             Diet regular Room service appropriate? Yes; Fluid consistency: Thin  Diet effective now                   MEDICATIONS: Scheduled Meds:  acetaminophen  1,000 mg Oral TID   docusate sodium  100 mg Oral BID   enoxaparin (LOVENOX) injection  40 mg Subcutaneous Q24H   nicotine  14 mg Transdermal Daily  Continuous Infusions:  sodium chloride     sodium chloride     ampicillin-sulbactam (UNASYN) IV 3 g (07/28/22 1020)   methocarbamol (ROBAXIN) IV     PRN Meds:.acetaminophen, albuterol, bisacodyl, HYDROmorphone (DILAUDID) injection, melatonin,  methocarbamol **OR** methocarbamol (ROBAXIN) IV, metoCLOPramide **OR** metoCLOPramide (REGLAN) injection, morphine injection, ondansetron **OR** ondansetron (ZOFRAN) IV, oxyCODONE, oxyCODONE, oxyCODONE, polyethylene glycol, simethicone   I have personally reviewed following labs and imaging studies  LABORATORY DATA:  Recent Labs  Lab 07/23/22 1016 07/23/22 2034 07/26/22 0429 07/27/22 0149 07/28/22 0441  WBC 26.5* 24.5* 15.8* 16.6* 11.6*  HGB 9.4* 11.2* 9.5* 10.0* 9.8*  HCT 28.4* 35.3* 28.9* 32.1* 31.7*  PLT 322 501* 433* 529* 548*  MCV 82.1 83.5 81.6 86.1 86.6  MCH 27.2 26.5 26.8 26.8 26.8  MCHC 33.1 31.7 32.9 31.2 30.9  RDW 16.2* 16.5* 16.2* 17.0* 17.4*  LYMPHSABS 3.4 5.6*  --  7.3* 5.3*  MONOABS 0.3 0.9  --  1.3* 0.9  EOSABS 0.0 0.1  --  0.7* 0.7*  BASOSABS 0.0 0.1  --  0.0 0.1    Recent Labs  Lab 07/22/22 0544 07/23/22 0348 07/24/22 0247 07/27/22 0149 07/28/22 0441  NA 141 140 141 143 139  K 3.9 4.1 3.6 4.2 3.7  CL 111 107 106 107 107  CO2 25 24 29 30 27   GLUCOSE 159* 140* 111* 85 101*  BUN 16 18 23* 13 15  CREATININE 0.73 0.75 1.09 0.94 0.84  CALCIUM 8.8* 8.7* 8.2* 8.6* 8.3*  AST  --   --  53* 23 14*  ALT  --   --  24 19 16   ALKPHOS  --   --  49 47 42  BILITOT  --   --  0.2* 0.3 0.3  ALBUMIN  --   --  2.3* 2.8* 2.7*  MG  --   --   --  2.1 2.1  CRP  --   --  7.3*  --   --    RADIOLOGY STUDIES/RESULTS: CT FOREARM RIGHT W CONTRAST  Result Date: 07/27/2022 CLINICAL DATA:  Soft tissue infection suspected, upper arm, xray done EXAM: CT OF THE UPPER RIGHT EXTREMITY WITH CONTRAST TECHNIQUE: Multidetector CT imaging of the upper right extremity was performed according to the standard protocol following intravenous contrast administration. RADIATION DOSE REDUCTION: This exam was performed according to the departmental dose-optimization program which includes automated exposure control, adjustment of the mA and/or kV according to patient size and/or use of iterative  reconstruction technique. CONTRAST:  13mL OMNIPAQUE IOHEXOL 300 MG/ML  SOLN COMPARISON:  None Available. FINDINGS: Bones/Joint/Cartilage No acute fracture or dislocation. No erosion or periosteal elevation. Mild arthropathy of the first [facility] joint. No lytic or sclerotic bone lesion. Ligaments Suboptimally assessed by CT. Muscles and Tendons Rim enhancing intramuscular fluid collection within the superficial aspect of the flexor compartment of the mid right forearm predominantly involving the flexor carpi ulnaris muscle. Collection measures approximately 4.2 x 2.3 x 3.9 cm. No gas within the collection. No additional intramuscular fluid collections are seen within the forearm. Soft tissues Skin thickening and soft tissue edema of the forearm overlying the previously described abscess. No soft tissue gas. IMPRESSION: 1. Intramuscular abscess within the flexor compartment of the mid right forearm predominantly involving the flexor carpi ulnaris muscle measuring up to 4.2 cm with overlying cellulitis. 2. No acute osseous abnormality.  No evidence of osteomyelitis. Electronically Signed   By: 91m D.O.   On: 07/27/2022 18:01     LOS:  8 days   Signature  Susa Raring M.D on 07/28/2022 at 11:53 AM   -  To page go to [email or web address]

## 2022-07-28 NOTE — Progress Notes (Signed)
Patient ID: James Baker, male   DOB: 01-Jun-1976, 46 y.o.   MRN: [id number] Patient is status post debridement and closure for abscess right arm.  Patient also has a large fluid collection over the ulnar aspect of the right forearm.  Patient feels like he may have slept on the arm against the bed rail.  The fluid collection is tender to palpation patient has no pain with active or passive range of motion of the fingers or wrist.  CT scan does show an isolated fluid collection.  With patient's recent infection in the same arm and the fluid collection in the forearm patient is at risk for this becoming infected.  We will plan for debridement of the right forearm on Wednesday.

## 2022-07-29 DIAGNOSIS — Z720 Tobacco use: Secondary | ICD-10-CM | POA: Diagnosis not present

## 2022-07-29 DIAGNOSIS — A419 Sepsis, unspecified organism: Secondary | ICD-10-CM | POA: Diagnosis not present

## 2022-07-29 DIAGNOSIS — L03113 Cellulitis of right upper limb: Secondary | ICD-10-CM | POA: Diagnosis not present

## 2022-07-29 DIAGNOSIS — L0390 Cellulitis, unspecified: Secondary | ICD-10-CM | POA: Diagnosis not present

## 2022-07-29 DIAGNOSIS — R6520 Severe sepsis without septic shock: Secondary | ICD-10-CM | POA: Diagnosis not present

## 2022-07-29 LAB — CBC WITH DIFFERENTIAL/PLATELET
Abs Immature Granulocytes: 0.4 10*3/uL — ABNORMAL HIGH (ref 0.00–0.07)
Basophils Absolute: 0.1 10*3/uL (ref 0.0–0.1)
Basophils Relative: 1 %
Eosinophils Absolute: 1.1 10*3/uL — ABNORMAL HIGH (ref 0.0–0.5)
Eosinophils Relative: 8 %
HCT: 33.8 % — ABNORMAL LOW (ref 39.0–52.0)
Hemoglobin: 10.5 g/dL — ABNORMAL LOW (ref 13.0–17.0)
Lymphocytes Relative: 46 %
Lymphs Abs: 6.1 10*3/uL — ABNORMAL HIGH (ref 0.7–4.0)
MCH: 26.8 pg (ref 26.0–34.0)
MCHC: 31.1 g/dL (ref 30.0–36.0)
MCV: 86.2 fL (ref 80.0–100.0)
Metamyelocytes Relative: 2 %
Monocytes Absolute: 0.9 10*3/uL (ref 0.1–1.0)
Monocytes Relative: 7 %
Myelocytes: 1 %
Neutro Abs: 4.6 10*3/uL (ref 1.7–7.7)
Neutrophils Relative %: 35 %
Platelets: 600 10*3/uL — ABNORMAL HIGH (ref 150–400)
RBC: 3.92 MIL/uL — ABNORMAL LOW (ref 4.22–5.81)
RDW: 17.7 % — ABNORMAL HIGH (ref 11.5–15.5)
WBC: 13.2 10*3/uL — ABNORMAL HIGH (ref 4.0–10.5)
nRBC: 0.2 % (ref 0.0–0.2)

## 2022-07-29 LAB — COMPREHENSIVE METABOLIC PANEL
ALT: 15 U/L (ref 0–44)
AST: 16 U/L (ref 15–41)
Albumin: 2.9 g/dL — ABNORMAL LOW (ref 3.5–5.0)
Alkaline Phosphatase: 45 U/L (ref 38–126)
Anion gap: 8 (ref 5–15)
BUN: 19 mg/dL (ref 6–20)
CO2: 27 mmol/L (ref 22–32)
Calcium: 9 mg/dL (ref 8.9–10.3)
Chloride: 103 mmol/L (ref 98–111)
Creatinine, Ser: 1.07 mg/dL (ref 0.61–1.24)
GFR, Estimated: >60 mL/min (ref >60)
Glucose, Bld: 93 mg/dL (ref 70–99)
Potassium: 4.1 mmol/L (ref 3.5–5.1)
Sodium: 138 mmol/L (ref 135–145)
Total Bilirubin: <0.1 mg/dL — ABNORMAL LOW (ref 0.3–1.2)
Total Protein: 6 g/dL — ABNORMAL LOW (ref 6.5–8.1)

## 2022-07-29 LAB — PROTIME-INR
INR: 1 (ref 0.8–1.2)
Prothrombin Time: 12.7 seconds (ref 11.4–15.2)

## 2022-07-29 LAB — TYPE AND SCREEN
ABO/RH(D): A POS
Antibody Screen: NEGATIVE

## 2022-07-29 LAB — MAGNESIUM: Magnesium: 2.1 mg/dL (ref 1.7–2.4)

## 2022-07-29 LAB — ABO/RH: ABO/RH(D): A POS

## 2022-07-29 NOTE — Progress Notes (Signed)
PROGRESS NOTE        PATIENT DETAILS Name: James Baker Age: 46 y.o. Sex: male Date of Birth: October 20, 1976 Admit Date: 07/20/2022 Admitting Physician Onnie Boer, MD SEG:[id number], No Pcp Per  Brief Summary: Patient is a 46 y.o.  male with history of tobacco, occasional cocaine/marijuana use-who gave himself a tattoo in the right forearm-presented with yellow/greenish discharge surrounding the tattoo area-subsequent pain and swelling of his right arm.  He was found to have necrotizing tissue infection with abscess formation-and subsequently admitted to the [facility] service.  See below for further details.   Significant events: 8/20>> admit to [facility] at [facility]-right arm necrotizing infection/abscess formation. 8/21>> transfer to [facility] irrigation and debridement by orthopedics. 8/22>> transfer to [facility]  Significant studies: 8/20>> CT right humerus: Large enhancing abscess with gas-involving deltoid/biceps muscles.  Significant microbiology data: 8/20>> COVID/influenza PCR: Negative 8/20>> blood culture: No growth 8/21>> intraoperative cultures-right arm: Streptococcus constellatus, few actinomyces-microbiology lab holding for possible anaerobe. 8/23>> intraoperative culture-right arm: Negative  Procedures: 8/21>> irrigation/debridement-right arm-by orthopedics at [facility]. 8/23>> irrigation/debridement of right arm  Consults: Orthopedics. Infectious disease  Subjective:  Patient in bed, appears comfortable, denies any headache, no fever, no chest pain or pressure, no shortness of breath , no abdominal pain. No new focal weakness.   Objective: Vitals: Blood pressure (!) 125/90, pulse 73, temperature 98.3 F (36.8 C), temperature source Oral, resp. rate 18, height 5\' 10"  (1.778 m), weight 61.2 kg, SpO2 95 %.   Exam:  Awake Alert, No new F.N deficits, Normal affect [place].AT,PERRAL Supple Neck, No JVD,   Symmetrical Chest wall movement, Good air movement  bilaterally, CTAB RRR,No Gallops, Rubs or new Murmurs,  +ve B.Sounds, Abd Soft, No tenderness,   No Cyanosis, right arm with wound VAC, mid forearm swelling   Assessment/Plan:  Severe sepsis with necrotizing soft tissue infection involving right arm with abscess formation: Afebrile-seen by orthopedics and ID, underwent multiple I&D procedures, currently has right arm wound VAC, on Unasyn per ID.  Follow final incision and drainage culture and sensitivity data.  Antibiotics per ID.  Mild swelling in the right mid forearm: CT obtained, DW Dr , repeat abscess, I&D in 2-3 days.  Tobacco/cocaine/marijuana use: Counseled-denies IVDA.  BMI: Estimated body mass index is 19.37 kg/m as calculated from the following:   Height as of this encounter: 5\' 10"  (1.778 m).   Weight as of this encounter: 61.2 kg.   Code status:   Code Status: Full Code   DVT Prophylaxis: Start on Lovenox daily enoxaparin (LOVENOX) injection 40 mg Start: 07/27/22 1100 SCDs Start: 07/25/22 1213 SCDs Start: 07/23/22 1927   Family Communication: None at bedside today.   Disposition Plan: Status is: Inpatient Remains inpatient appropriate because: Necrotizing infection of right arm-s/p I&D x3-overall improved-awaiting final cultures from 8/21 to be speciated before consideration of discharge.   Planned Discharge Destination:Home   Diet: Diet Order             Diet regular Room service appropriate? Yes; Fluid consistency: Thin  Diet effective now                   MEDICATIONS: Scheduled Meds:  acetaminophen  1,000 mg Oral TID   docusate sodium  100 mg Oral BID   enoxaparin (LOVENOX) injection  40 mg Subcutaneous Q24H   nicotine  14 mg Transdermal Daily  Continuous Infusions:  sodium chloride     sodium chloride     ampicillin-sulbactam (UNASYN) IV 3 g (07/29/22 0327)   methocarbamol (ROBAXIN) IV     PRN Meds:.acetaminophen, albuterol, bisacodyl, HYDROmorphone (DILAUDID) injection, melatonin,  methocarbamol **OR** methocarbamol (ROBAXIN) IV, metoCLOPramide **OR** metoCLOPramide (REGLAN) injection, morphine injection, ondansetron **OR** ondansetron (ZOFRAN) IV, oxyCODONE, oxyCODONE, oxyCODONE, polyethylene glycol, simethicone   I have personally reviewed following labs and imaging studies  LABORATORY DATA:  Recent Labs  Lab 07/23/22 1016 07/23/22 2034 07/26/22 0429 07/27/22 0149 07/28/22 0441 07/29/22 0237  WBC 26.5* 24.5* 15.8* 16.6* 11.6* 13.2*  HGB 9.4* 11.2* 9.5* 10.0* 9.8* 10.5*  HCT 28.4* 35.3* 28.9* 32.1* 31.7* 33.8*  PLT 322 501* 433* 529* 548* 600*  MCV 82.1 83.5 81.6 86.1 86.6 86.2  MCH 27.2 26.5 26.8 26.8 26.8 26.8  MCHC 33.1 31.7 32.9 31.2 30.9 31.1  RDW 16.2* 16.5* 16.2* 17.0* 17.4* 17.7*  LYMPHSABS 3.4 5.6*  --  7.3* 5.3* 6.1*  MONOABS 0.3 0.9  --  1.3* 0.9 0.9  EOSABS 0.0 0.1  --  0.7* 0.7* 1.1*  BASOSABS 0.0 0.1  --  0.0 0.1 0.1    Recent Labs  Lab 07/23/22 0348 07/24/22 0247 07/27/22 0149 07/28/22 0441 07/29/22 0237  NA 140 141 143 139 138  K 4.1 3.6 4.2 3.7 4.1  CL 107 106 107 107 103  CO2 24 29 30 27 27   GLUCOSE 140* 111* 85 101* 93  BUN 18 23* 13 15 19   CREATININE 0.75 1.09 0.94 0.84 1.07  CALCIUM 8.7* 8.2* 8.6* 8.3* 9.0  AST  --  53* 23 14* 16  ALT  --  24 19 16 15   ALKPHOS  --  49 47 42 45  BILITOT  --  0.2* 0.3 0.3 <0.1*  ALBUMIN  --  2.3* 2.8* 2.7* 2.9*  MG  --   --  2.1 2.1 2.1  CRP  --  7.3*  --   --   --    RADIOLOGY STUDIES/RESULTS: CT FOREARM RIGHT W CONTRAST  Result Date: 07/27/2022 CLINICAL DATA:  Soft tissue infection suspected, upper arm, xray done EXAM: CT OF THE UPPER RIGHT EXTREMITY WITH CONTRAST TECHNIQUE: Multidetector CT imaging of the upper right extremity was performed according to the standard protocol following intravenous contrast administration. RADIATION DOSE REDUCTION: This exam was performed according to the departmental dose-optimization program which includes automated exposure control, adjustment of  the mA and/or kV according to patient size and/or use of iterative reconstruction technique. CONTRAST:  90mL OMNIPAQUE IOHEXOL 300 MG/ML  SOLN COMPARISON:  None Available. FINDINGS: Bones/Joint/Cartilage No acute fracture or dislocation. No erosion or periosteal elevation. Mild arthropathy of the first [place] joint. No lytic or sclerotic bone lesion. Ligaments Suboptimally assessed by CT. Muscles and Tendons Rim enhancing intramuscular fluid collection within the superficial aspect of the flexor compartment of the mid right forearm predominantly involving the flexor carpi ulnaris muscle. Collection measures approximately 4.2 x 2.3 x 3.9 cm. No gas within the collection. No additional intramuscular fluid collections are seen within the forearm. Soft tissues Skin thickening and soft tissue edema of the forearm overlying the previously described abscess. No soft tissue gas. IMPRESSION: 1. Intramuscular abscess within the flexor compartment of the mid right forearm predominantly involving the flexor carpi ulnaris muscle measuring up to 4.2 cm with overlying cellulitis. 2. No acute osseous abnormality.  No evidence of osteomyelitis. Electronically Signed   By: 07/29/2022 D.O.   On: 07/27/2022 18:01  LOS: 9 days   Signature  Susa Raring M.D on 07/29/2022 at 8:57 AM   -  To page go to [email or web address]

## 2022-07-29 NOTE — Progress Notes (Signed)
Subjective: Still with tenderness and palpable abscess right forearm Antibiotics:  Anti-infectives (From admission, onward)    Start     Dose/Rate Route Frequency Ordered Stop   07/25/22 1000  ceFAZolin (ANCEF) IVPB 2g/100 mL premix        2 g 200 mL/hr over 30 Minutes Intravenous On call to O.R. 07/25/22 0947 07/25/22 1054   07/25/22 0936  ceFAZolin (ANCEF) 2-4 GM/100ML-% IVPB       Note to Pharmacy: Shanda BumpsSerrano, Marissa M: cabinet override      07/25/22 0936 07/25/22 1053   07/24/22 1600  Ampicillin-Sulbactam (UNASYN) 3 g in sodium chloride 0.9 % 100 mL IVPB        3 g 200 mL/hr over 30 Minutes Intravenous Every 6 hours 07/24/22 1427     07/23/22 1430  ceFAZolin (ANCEF) IVPB 2g/100 mL premix        2 g 200 mL/hr over 30 Minutes Intravenous On call to O.R. 07/22/22 1910 07/23/22 1737   07/22/22 2200  linezolid (ZYVOX) IVPB 600 mg        600 mg 300 mL/hr over 60 Minutes Intravenous Every 12 hours 07/22/22 1354 07/25/22 2359   07/22/22 1445  piperacillin-tazobactam (ZOSYN) IVPB 3.375 g  Status:  Discontinued        3.375 g 12.5 mL/hr over 240 Minutes Intravenous Every 8 hours 07/22/22 1353 07/24/22 1427   07/21/22 2200  vancomycin (VANCOREADY) IVPB 750 mg/150 mL  Status:  Discontinued       See Hyperspace for full Linked Orders Report.   750 mg 150 mL/hr over 60 Minutes Intravenous Every 12 hours 07/21/22 1457 07/22/22 1354   07/21/22 1458  cefTRIAXone (ROCEPHIN) 2 g in sodium chloride 0.9 % 100 mL IVPB  Status:  Discontinued        2 g 200 mL/hr over 30 Minutes Intravenous Daily 07/21/22 1458 07/22/22 0742   07/21/22 0615  vancomycin (VANCOREADY) IVPB 750 mg/150 mL  Status:  Discontinued       See Hyperspace for full Linked Orders Report.   750 mg 150 mL/hr over 60 Minutes Intravenous Every 12 hours 07/20/22 1805 07/21/22 1457   07/20/22 1815  vancomycin (VANCOREADY) IVPB 1250 mg/250 mL       See Hyperspace for full Linked Orders Report.   1,250 mg 166.7 mL/hr over 90  Minutes Intravenous  Once 07/20/22 1805 07/20/22 2015   07/20/22 1800  metroNIDAZOLE (FLAGYL) IVPB 500 mg  Status:  Discontinued        500 mg 100 mL/hr over 60 Minutes Intravenous Every 12 hours 07/20/22 1749 07/22/22 1353   07/20/22 1615  cefTRIAXone (ROCEPHIN) 2 g in sodium chloride 0.9 % 100 mL IVPB  Status:  Discontinued        2 g 200 mL/hr over 30 Minutes Intravenous Every 24 hours 07/20/22 1613 07/21/22 1458       Medications: Scheduled Meds:  acetaminophen  1,000 mg Oral TID   docusate sodium  100 mg Oral BID   enoxaparin (LOVENOX) injection  40 mg Subcutaneous Q24H   nicotine  14 mg Transdermal Daily   Continuous Infusions:  sodium chloride     sodium chloride     ampicillin-sulbactam (UNASYN) IV 3 g (07/29/22 1007)   methocarbamol (ROBAXIN) IV     PRN Meds:.acetaminophen, albuterol, bisacodyl, HYDROmorphone (DILAUDID) injection, melatonin, methocarbamol **OR** methocarbamol (ROBAXIN) IV, metoCLOPramide **OR** metoCLOPramide (REGLAN) injection, morphine injection, ondansetron **OR** ondansetron (ZOFRAN) IV, oxyCODONE, oxyCODONE, oxyCODONE, polyethylene glycol, simethicone  Objective: Weight change:   Intake/Output Summary (Last 24 hours) at 07/29/2022 1327 Last data filed at 07/29/2022 0842 Gross per 24 hour  Intake 924 ml  Output 1350 ml  Net -426 ml    Blood pressure (!) 125/90, pulse 73, temperature 98.3 F (36.8 C), temperature source Oral, resp. rate 18, height 5\' 10"  (1.778 m), weight 61.2 kg, SpO2 95 %. Temp:  [98.3 F (36.8 C)-98.6 F (37 C)] 98.3 F (36.8 C) (08/29 0843) Pulse Rate:  [73-77] 73 (08/29 0843) Resp:  [16-18] 18 (08/29 0843) BP: (125-137)/(89-90) 125/90 (08/29 0843) SpO2:  [94 %-95 %] 95 % (08/29 0843)  Physical Exam: Physical Exam Constitutional:      Appearance: He is well-developed.  HENT:     Head: Normocephalic and atraumatic.  Eyes:     Conjunctiva/sclera: Conjunctivae normal.  Cardiovascular:     Rate and Rhythm: Normal  rate and regular rhythm.  Pulmonary:     Effort: Pulmonary effort is normal. No respiratory distress.     Breath sounds: Normal breath sounds. No stridor. No wheezing.  Abdominal:     General: There is no distension.     Palpations: Abdomen is soft.  Musculoskeletal:        General: Normal range of motion.     Right forearm: Swelling and edema present.     Cervical back: Normal range of motion and neck supple.  Skin:    General: Skin is warm and dry.     Findings: No erythema or rash.  Neurological:     General: No focal deficit present.     Mental Status: He is alert and oriented to person, place, and time.  Psychiatric:        Mood and Affect: Mood normal.        Behavior: Behavior normal.        Thought Content: Thought content normal.        Judgment: Judgment normal.      With wound vacuum in place and shown forearm also with palpable abscess   CBC:    BMET Recent Labs    07/28/22 0441 07/29/22 0237  NA 139 138  K 3.7 4.1  CL 107 103  CO2 27 27  GLUCOSE 101* 93  BUN 15 19  CREATININE 0.84 1.07  CALCIUM 8.3* 9.0      Liver Panel  Recent Labs    07/28/22 0441 07/29/22 0237  PROT 5.7* 6.0*  ALBUMIN 2.7* 2.9*  AST 14* 16  ALT 16 15  ALKPHOS 42 45  BILITOT 0.3 <0.1*        Sedimentation Rate No results for input(s): "ESRSEDRATE" in the last 72 hours. C-Reactive Protein No results for input(s): "CRP" in the last 72 hours.  Micro Results: Recent Results (from the past 720 hour(s))  Resp Panel by RT-PCR (Flu A&B, Covid) Anterior Nasal Swab     Status: None   Collection Time: 07/20/22  4:13 PM   Specimen: Anterior Nasal Swab  Result Value Ref Range Status   SARS Coronavirus 2 by RT PCR NEGATIVE NEGATIVE Final    Comment: (NOTE) SARS-CoV-2 target nucleic acids are NOT DETECTED.  The SARS-CoV-2 RNA is generally detectable in upper respiratory specimens during the acute phase of infection. The lowest concentration of SARS-CoV-2 viral copies  this assay can detect is 138 copies/mL. A negative result does not preclude SARS-Cov-2 infection and should not be used as the sole basis for treatment or other patient management decisions. A negative result may  occur with  improper specimen collection/handling, submission of specimen other than nasopharyngeal swab, presence of viral mutation(s) within the areas targeted by this assay, and inadequate number of viral copies(<138 copies/mL). A negative result must be combined with clinical observations, patient history, and epidemiological information. The expected result is Negative.  Fact Sheet for Patients:  BloggerCourse.com  Fact Sheet for Healthcare Providers:  SeriousBroker.it  This test is no t yet approved or cleared by the [place] FDA and  has been authorized for detection and/or diagnosis of SARS-CoV-2 by FDA under an Emergency Use Authorization (EUA). This EUA will remain  in effect (meaning this test can be used) for the duration of the COVID-19 declaration under Section 564(b)(1) of the Act, 21 U.S.C.section 360bbb-3(b)(1), unless the authorization is terminated  or revoked sooner.       Influenza A by PCR NEGATIVE NEGATIVE Final   Influenza B by PCR NEGATIVE NEGATIVE Final    Comment: (NOTE) The Xpert Xpress SARS-CoV-2/FLU/RSV plus assay is intended as an aid in the diagnosis of influenza from Nasopharyngeal swab specimens and should not be used as a sole basis for treatment. Nasal washings and aspirates are unacceptable for Xpert Xpress SARS-CoV-2/FLU/RSV testing.  Fact Sheet for Patients: BloggerCourse.com  Fact Sheet for Healthcare Providers: SeriousBroker.it  This test is not yet approved or cleared by the [place] FDA and has been authorized for detection and/or diagnosis of SARS-CoV-2 by FDA under an Emergency Use Authorization (EUA). This EUA  will remain in effect (meaning this test can be used) for the duration of the COVID-19 declaration under Section 564(b)(1) of the Act, 21 U.S.C. section 360bbb-3(b)(1), unless the authorization is terminated or revoked.  Performed at [facility], [address], [place], [place] [address]   Blood Culture (routine x 2)     Status: None   Collection Time: 07/20/22  4:18 PM   Specimen: BLOOD LEFT FOREARM  Result Value Ref Range Status   Specimen Description   Final    BLOOD LEFT FOREARM BOTTLES DRAWN AEROBIC AND ANAEROBIC   Special Requests Blood Culture adequate volume  Final   Culture   Final    NO GROWTH 5 DAYS Performed at [facility], [address], [place], [place] [address]    Report Status 07/25/2022 FINAL  Final  Blood Culture (routine x 2)     Status: None   Collection Time: 07/20/22  4:33 PM   Specimen: Left Antecubital; Blood  Result Value Ref Range Status   Specimen Description   Final    LEFT ANTECUBITAL BOTTLES DRAWN AEROBIC AND ANAEROBIC   Special Requests Blood Culture adequate volume  Final   Culture   Final    NO GROWTH 5 DAYS Performed at [facility], [address] [place], [place] [address]    Report Status 07/25/2022 FINAL  Final  MRSA Next Gen by PCR, Nasal     Status: None   Collection Time: 07/21/22 12:54 AM   Specimen: Nasal Mucosa; Nasal Swab  Result Value Ref Range Status   MRSA by PCR Next Gen NOT DETECTED NOT DETECTED Final    Comment: (NOTE) The GeneXpert MRSA Assay (FDA approved for NASAL specimens only), is one component of a comprehensive MRSA colonization surveillance program. It is not intended to diagnose MRSA infection nor to guide or monitor treatment for MRSA infections. Test performance is not FDA approved in patients less than 3 years old. Performed at [facility], [address] [place], [place] [address]  Fungus Culture With Stain     Status: None (Preliminary result)   Collection Time: 07/21/22 10:23 AM    Specimen: PATH Other; Tissue  Result Value Ref Range Status   Fungus Stain Final report  Final    Comment: (NOTE) Performed At: [facility] [address] [place], [place] [id number] Jolene Schimke MD DJ:[id number]    Fungus (Mycology) Culture PENDING  Incomplete   Fungal Source ABSCESS  Final    Comment: Performed at [facility], [address], [place], [place] [address]  Aerobic/Anaerobic Culture w Gram Stain (surgical/deep wound)     Status: None (Preliminary result)   Collection Time: 07/21/22 10:23 AM   Specimen: PATH Other; Tissue  Result Value Ref Range Status   Specimen Description ABSCESS  Final   Special Requests  RIGHT UPPER ARM  Final   Gram Stain   Final    MODERATE WBC PRESENT,BOTH PMN AND MONONUCLEAR MODERATE GRAM NEGATIVE RODS MODERATE GRAM POSITIVE COCCI IN PAIRS AND CHAINS FEW GRAM POSITIVE RODS    Culture   Final    MODERATE STREPTOCOCCUS CONSTELLATUS Sent to [facility] for further susceptibility testing. FEW ACTINOMYCES ODONTOLYTICUS Standardized susceptibility testing for this organism is not available. MODERATE BACTEROIDES ORALIS BETA LACTAMASE POSITIVE Performed at [facility], [address], [place], [place] [address]    Report Status PENDING  Incomplete  Acid Fast Smear (AFB)     Status: None   Collection Time: 07/21/22 10:23 AM   Specimen: PATH Other; Tissue  Result Value Ref Range Status   AFB Specimen Processing Concentration  Final   Acid Fast Smear Negative  Final    Comment: (NOTE) Performed At: [facility] [address] [place], [place] [id number] Jolene Schimke MD KG:[id number]    Source (AFB) ABSCESS  Final    Comment: Performed at [facility], [address] [place], [place] [address]  Fungus Culture Result     Status: None   Collection Time: 07/21/22 10:23 AM  Result Value Ref Range Status   Result 1 Comment  Final    Comment: (NOTE) KOH/Calcofluor preparation:  no fungus  observed. Performed At: [facility] [address] [place], [place] [id number] Jolene Schimke MD AJ:[id number]   Susceptibility, Aer + Anaerob     Status: Abnormal   Collection Time: 07/21/22 10:23 AM  Result Value Ref Range Status   Suscept, Aer + Anaerob Preliminary report (A)  Final    Comment: (NOTE) Performed At: [facility] [address] [place], [place] [id number] Jolene Schimke MD MO:[id number]    Source of Sample   Final    STREP.CONSTELLATUS SUSCEPTIBILITY ABSCESS RIGHT UPPER ARM    Comment: Performed at [facility], [address] [place], [place] [address]  Susceptibility Result     Status: Abnormal   Collection Time: 07/21/22 10:23 AM  Result Value Ref Range Status   Suscept Result 1 Comment (A)  Final    Comment: (NOTE) Streptococcus constellatus Identification performed by account, not confirmed by this laboratory. Performed At: [facility] [address] [place], [place] [id number] Jolene Schimke MD YF:[id number]   Fungus Culture With Stain     Status: None (Preliminary result)   Collection Time: 07/23/22  5:44 PM   Specimen: Soft Tissue, Other  Result Value Ref Range Status   Fungus Stain Final report  Final    Comment: (NOTE) Performed At: [facility] [address] [place], [place] [id number] Jolene Schimke MD DJ:[id number]  Fungus (Mycology) Culture PENDING  Incomplete   Fungal Source TISSUE  Final    Comment: Performed at [facility], [address] [place], [place] [address]  Aerobic/Anaerobic Culture w Gram Stain (surgical/deep wound)     Status: None   Collection Time: 07/23/22  5:44 PM   Specimen: Soft Tissue, Other  Result Value Ref Range Status   Specimen Description TISSUE  Final   Special Requests RIGHT ARM FASCIA  Final   Gram Stain NO ORGANISMS SEEN NO WBC SEEN   Final   Culture   Final    No growth aerobically or anaerobically. Performed at [facility], [address] [place], [place] [address]    Report Status 07/28/2022 FINAL  Final  Acid Fast Smear (AFB)     Status: None   Collection Time: 07/23/22  5:44 PM   Specimen: Soft Tissue, Other  Result Value Ref Range Status   AFB Specimen Processing Concentration  Final   Acid Fast Smear Negative  Final    Comment: (NOTE) Performed At: [facility] [address] [place], [place] [id number] Jolene Schimke MD WG:[id number]    Source (AFB) TISSUE  Final    Comment: Performed at [facility], [address], [place], [place] [address]  Fungus Culture Result     Status: None   Collection Time: 07/23/22  5:44 PM  Result Value Ref Range Status   Result 1 Comment  Final    Comment: (NOTE) KOH/Calcofluor preparation:  no fungus observed. Performed At: [facility] [address] [place], [place] [id number] Jolene Schimke MD KG:[id number]     Studies/Results: CT FOREARM RIGHT W CONTRAST  Result Date: 07/27/2022 CLINICAL DATA:  Soft tissue infection suspected, upper arm, xray done EXAM: CT OF THE UPPER RIGHT EXTREMITY WITH CONTRAST TECHNIQUE: Multidetector CT imaging of the upper right extremity was performed according to the standard protocol following intravenous contrast administration. RADIATION DOSE REDUCTION: This exam was performed according to the departmental dose-optimization program which includes automated exposure control, adjustment of the mA and/or kV according to patient size and/or use of iterative reconstruction technique. CONTRAST:  80mL OMNIPAQUE IOHEXOL 300 MG/ML  SOLN COMPARISON:  None Available. FINDINGS: Bones/Joint/Cartilage No acute fracture or dislocation. No erosion or periosteal elevation. Mild arthropathy of the first [facility] joint. No lytic or sclerotic bone lesion. Ligaments Suboptimally assessed by CT. Muscles and Tendons Rim enhancing intramuscular fluid collection within the superficial aspect of the flexor compartment of the mid right forearm predominantly  involving the flexor carpi ulnaris muscle. Collection measures approximately 4.2 x 2.3 x 3.9 cm. No gas within the collection. No additional intramuscular fluid collections are seen within the forearm. Soft tissues Skin thickening and soft tissue edema of the forearm overlying the previously described abscess. No soft tissue gas. IMPRESSION: 1. Intramuscular abscess within the flexor compartment of the mid right forearm predominantly involving the flexor carpi ulnaris muscle measuring up to 4.2 cm with overlying cellulitis. 2. No acute osseous abnormality.  No evidence of osteomyelitis. Electronically Signed   By: Duanne Guess D.O.   On: 07/27/2022 18:01      Assessment/Plan:  INTERVAL HISTORY:   Patient to go to OR tomorrow   Principal Problem:   Necrotizing cellulitis of right arm Active Problems:   Severe sepsis (HCC)   Hyponatremia   Tobacco abuse   Cellulitis of right upper extremity    James Baker is a 46 y.o. male with  hx of alcohol  abuse, who was apparently performing tattoo on himself and has developed necrotizing fasciitis of his upper extremity with curious constellation of bacteria including Streptococcus constellatus actinomyces and Bacteroides oralis isolated on culture after I and D.   CT of the forearm on August 27 shows an additional abscess in the forearm.  #1  Progressing fasciitis following upper arm and tattoo site and now also abscess forearm  --greatly appreciate Dr. Lajoyce Corners and Dr. Doristine Church vigilance on this patient  VERY curious that the bacteria isolated are all typically oral flora but he denies saliva or spit when he was trying to give himself a tattoo  Continue Unasyn.  Follow-up culture data from the OR tomorrow.  Given actinomyces species will need protracted amoxicillin therapy     LOS: 9 days   Acey Lav 07/29/2022, 1:27 PM

## 2022-07-29 NOTE — H&P (View-Only) (Signed)
Patient ID: James Baker, male   DOB: 05/24/1976, 46 y.o.   MRN: [id number] Patient is a 46-year-old gentleman status post debridement right arm.  He has a persistent painful fluctuant mass over the ulnar border right forearm.  Plan for debridement tomorrow. 

## 2022-07-29 NOTE — Progress Notes (Signed)
Patient ID: James Baker, male   DOB: 12/20/1975, 46 y.o.   MRN: [id number] Patient is a 46 year old gentleman status post debridement right arm.  He has a persistent painful fluctuant mass over the ulnar border right forearm.  Plan for debridement tomorrow.

## 2022-07-30 ENCOUNTER — Encounter (HOSPITAL_COMMUNITY)
Admission: EM | Discharge status: Against Medical Advice | Payer: Self-pay | Source: Home / Self Care | Attending: Internal Medicine

## 2022-07-30 ENCOUNTER — Encounter (HOSPITAL_COMMUNITY): Payer: Self-pay | Admitting: Internal Medicine

## 2022-07-30 ENCOUNTER — Other Ambulatory Visit: Payer: Self-pay

## 2022-07-30 ENCOUNTER — Inpatient Hospital Stay (HOSPITAL_COMMUNITY): Payer: 59 | Admitting: Anesthesiology

## 2022-07-30 ENCOUNTER — Inpatient Hospital Stay (HOSPITAL_COMMUNITY): Payer: Self-pay | Admitting: Anesthesiology

## 2022-07-30 DIAGNOSIS — J449 Chronic obstructive pulmonary disease, unspecified: Secondary | ICD-10-CM

## 2022-07-30 DIAGNOSIS — F17210 Nicotine dependence, cigarettes, uncomplicated: Secondary | ICD-10-CM | POA: Diagnosis not present

## 2022-07-30 DIAGNOSIS — L03113 Cellulitis of right upper limb: Secondary | ICD-10-CM | POA: Diagnosis not present

## 2022-07-30 DIAGNOSIS — L02413 Cutaneous abscess of right upper limb: Secondary | ICD-10-CM

## 2022-07-30 DIAGNOSIS — L0390 Cellulitis, unspecified: Secondary | ICD-10-CM | POA: Diagnosis not present

## 2022-07-30 DIAGNOSIS — A419 Sepsis, unspecified organism: Secondary | ICD-10-CM | POA: Diagnosis not present

## 2022-07-30 DIAGNOSIS — Z720 Tobacco use: Secondary | ICD-10-CM | POA: Diagnosis not present

## 2022-07-30 HISTORY — PX: I & D EXTREMITY: SHX5045

## 2022-07-30 LAB — CBC WITH DIFFERENTIAL/PLATELET
Abs Immature Granulocytes: 0 10*3/uL (ref 0.00–0.07)
Basophils Absolute: 0 10*3/uL (ref 0.0–0.1)
Basophils Relative: 0 %
Eosinophils Absolute: 0.6 10*3/uL — ABNORMAL HIGH (ref 0.0–0.5)
Eosinophils Relative: 4 %
HCT: 39.7 % (ref 39.0–52.0)
Hemoglobin: 12.9 g/dL — ABNORMAL LOW (ref 13.0–17.0)
Lymphocytes Relative: 25 %
Lymphs Abs: 3.7 10*3/uL (ref 0.7–4.0)
MCH: 26.9 pg (ref 26.0–34.0)
MCHC: 32.5 g/dL (ref 30.0–36.0)
MCV: 82.9 fL (ref 80.0–100.0)
Monocytes Absolute: 0.6 10*3/uL (ref 0.1–1.0)
Monocytes Relative: 4 %
Neutro Abs: 9.9 10*3/uL — ABNORMAL HIGH (ref 1.7–7.7)
Neutrophils Relative %: 67 %
Platelets: 682 10*3/uL — ABNORMAL HIGH (ref 150–400)
RBC: 4.79 MIL/uL (ref 4.22–5.81)
RDW: 18.6 % — ABNORMAL HIGH (ref 11.5–15.5)
WBC: 14.8 10*3/uL — ABNORMAL HIGH (ref 4.0–10.5)
nRBC: 0 % (ref 0.0–0.2)
nRBC: 0 /100 WBC

## 2022-07-30 LAB — COMPREHENSIVE METABOLIC PANEL
ALT: 17 U/L (ref 0–44)
AST: 18 U/L (ref 15–41)
Albumin: 3.6 g/dL (ref 3.5–5.0)
Alkaline Phosphatase: 49 U/L (ref 38–126)
Anion gap: 9 (ref 5–15)
BUN: 21 mg/dL — ABNORMAL HIGH (ref 6–20)
CO2: 28 mmol/L (ref 22–32)
Calcium: 9.7 mg/dL (ref 8.9–10.3)
Chloride: 102 mmol/L (ref 98–111)
Creatinine, Ser: 0.86 mg/dL (ref 0.61–1.24)
GFR, Estimated: >60 mL/min (ref >60)
Glucose, Bld: 106 mg/dL — ABNORMAL HIGH (ref 70–99)
Potassium: 4.6 mmol/L (ref 3.5–5.1)
Sodium: 139 mmol/L (ref 135–145)
Total Bilirubin: 0.3 mg/dL (ref 0.3–1.2)
Total Protein: 7.5 g/dL (ref 6.5–8.1)

## 2022-07-30 LAB — MAGNESIUM: Magnesium: 2.3 mg/dL (ref 1.7–2.4)

## 2022-07-30 SURGERY — IRRIGATION AND DEBRIDEMENT EXTREMITY
Anesthesia: General | Laterality: Right

## 2022-07-30 MED ORDER — CHLORHEXIDINE GLUCONATE 0.12 % MT SOLN
OROMUCOSAL | Status: AC
  Administered 2022-07-30: 15 mL via OROMUCOSAL
  Filled 2022-07-30: qty 15

## 2022-07-30 MED ORDER — ACETAMINOPHEN 500 MG PO TABS: 1000.0000 mg | ORAL_TABLET | Freq: Once | ORAL | Status: DC | PRN

## 2022-07-30 MED ORDER — MIDAZOLAM HCL 2 MG/2ML IJ SOLN
INTRAMUSCULAR | Status: AC
  Filled 2022-07-30: qty 2

## 2022-07-30 MED ORDER — FENTANYL CITRATE (PF) 100 MCG/2ML IJ SOLN
25.0000 ug | INTRAMUSCULAR | Status: DC | PRN
  Administered 2022-07-30 (×3): 50 ug via INTRAVENOUS

## 2022-07-30 MED ORDER — OXYCODONE HCL 5 MG PO TABS
5.0000 mg | ORAL_TABLET | Freq: Once | ORAL | Status: AC | PRN
  Administered 2022-07-30: 5 mg via ORAL

## 2022-07-30 MED ORDER — CHLORHEXIDINE GLUCONATE 0.12 % MT SOLN
15.0000 mL | Freq: Once | OROMUCOSAL | Status: AC
Start: 2022-07-30 — End: 2022-07-30

## 2022-07-30 MED ORDER — LABETALOL HCL 5 MG/ML IV SOLN
5.0000 mg | INTRAVENOUS | Status: DC | PRN
  Administered 2022-07-30: 5 mg via INTRAVENOUS

## 2022-07-30 MED ORDER — ONDANSETRON HCL 4 MG/2ML IJ SOLN: 4.0000 mg | Freq: Four times a day (QID) | INTRAMUSCULAR | Status: DC | PRN

## 2022-07-30 MED ORDER — FENTANYL CITRATE (PF) 250 MCG/5ML IJ SOLN
INTRAMUSCULAR | Status: DC | PRN
Start: 2022-07-30 — End: 2022-07-30
  Administered 2022-07-30: 75 ug via INTRAVENOUS
  Administered 2022-07-30: 25 ug via INTRAVENOUS
  Administered 2022-07-30: 50 ug via INTRAVENOUS
  Administered 2022-07-30: 100 ug via INTRAVENOUS

## 2022-07-30 MED ORDER — MAGNESIUM CITRATE PO SOLN: 1.0000 | Freq: Once | ORAL | Status: DC | PRN

## 2022-07-30 MED ORDER — BISACODYL 10 MG RE SUPP: 10.0000 mg | Freq: Every day | RECTAL | Status: DC | PRN

## 2022-07-30 MED ORDER — MIDAZOLAM HCL 5 MG/5ML IJ SOLN
INTRAMUSCULAR | Status: DC | PRN
  Administered 2022-07-30: 2 mg via INTRAVENOUS

## 2022-07-30 MED ORDER — DOCUSATE SODIUM 100 MG PO CAPS
100.0000 mg | ORAL_CAPSULE | Freq: Two times a day (BID) | ORAL | Status: DC
  Administered 2022-07-31: 100 mg via ORAL
  Filled 2022-07-30 (×2): qty 1

## 2022-07-30 MED ORDER — 0.9 % SODIUM CHLORIDE (POUR BTL) OPTIME
TOPICAL | Status: DC | PRN
  Administered 2022-07-30: 1000 mL

## 2022-07-30 MED ORDER — PROPOFOL 10 MG/ML IV BOLUS
INTRAVENOUS | Status: DC | PRN
  Administered 2022-07-30: 150 mg via INTRAVENOUS

## 2022-07-30 MED ORDER — PROPOFOL 10 MG/ML IV BOLUS
INTRAVENOUS | Status: AC
  Filled 2022-07-30: qty 20

## 2022-07-30 MED ORDER — ACETAMINOPHEN 10 MG/ML IV SOLN
1000.0000 mg | Freq: Once | INTRAVENOUS | Status: DC | PRN
  Administered 2022-07-30: 1000 mg via INTRAVENOUS

## 2022-07-30 MED ORDER — METHOCARBAMOL 500 MG PO TABS
500.0000 mg | ORAL_TABLET | Freq: Four times a day (QID) | ORAL | Status: DC | PRN
  Administered 2022-07-30 – 2022-08-01 (×4): 500 mg via ORAL
  Filled 2022-07-30 (×3): qty 1

## 2022-07-30 MED ORDER — LACTATED RINGERS IV SOLN: INTRAVENOUS | Status: DC

## 2022-07-30 MED ORDER — ONDANSETRON HCL 4 MG/2ML IJ SOLN
INTRAMUSCULAR | Status: DC | PRN
  Administered 2022-07-30: 4 mg via INTRAVENOUS

## 2022-07-30 MED ORDER — LIDOCAINE HCL (CARDIAC) PF 100 MG/5ML IV SOSY
PREFILLED_SYRINGE | INTRAVENOUS | Status: DC | PRN
  Administered 2022-07-30: 50 mg via INTRATRACHEAL

## 2022-07-30 MED ORDER — ACETAMINOPHEN 160 MG/5ML PO SOLN: 1000.0000 mg | Freq: Once | ORAL | Status: DC | PRN

## 2022-07-30 MED ORDER — METHOCARBAMOL 1000 MG/10ML IJ SOLN: 500.0000 mg | Freq: Four times a day (QID) | INTRAVENOUS | Status: DC | PRN

## 2022-07-30 MED ORDER — POVIDONE-IODINE 10 % EX SWAB
2.0000 | Freq: Once | CUTANEOUS | Status: AC
  Administered 2022-07-30: 2 via TOPICAL

## 2022-07-30 MED ORDER — POLYETHYLENE GLYCOL 3350 17 G PO PACK: 17.0000 g | PACK | Freq: Every day | ORAL | Status: DC | PRN

## 2022-07-30 MED ORDER — ORAL CARE MOUTH RINSE: 15.0000 mL | Freq: Once | OROMUCOSAL | Status: AC

## 2022-07-30 MED ORDER — ONDANSETRON HCL 4 MG PO TABS: 4.0000 mg | ORAL_TABLET | Freq: Four times a day (QID) | ORAL | Status: DC | PRN

## 2022-07-30 MED ORDER — CHLORHEXIDINE GLUCONATE 4 % EX LIQD
60.0000 mL | Freq: Once | CUTANEOUS | Status: AC
  Administered 2022-07-30: 4 via TOPICAL
  Filled 2022-07-30: qty 60

## 2022-07-30 MED ORDER — LABETALOL HCL 5 MG/ML IV SOLN
INTRAVENOUS | Status: AC
  Filled 2022-07-30: qty 4

## 2022-07-30 MED ORDER — OXYCODONE HCL 5 MG PO TABS
ORAL_TABLET | ORAL | Status: AC
  Filled 2022-07-30: qty 1

## 2022-07-30 MED ORDER — OXYCODONE HCL 5 MG/5ML PO SOLN: 5.0000 mg | Freq: Once | ORAL | Status: AC | PRN

## 2022-07-30 MED ORDER — FENTANYL CITRATE (PF) 100 MCG/2ML IJ SOLN
INTRAMUSCULAR | Status: AC
  Filled 2022-07-30: qty 2

## 2022-07-30 MED ORDER — SODIUM CHLORIDE 0.9 % IV SOLN: INTRAVENOUS | Status: DC

## 2022-07-30 MED ORDER — CEFAZOLIN SODIUM-DEXTROSE 2-4 GM/100ML-% IV SOLN
2.0000 g | INTRAVENOUS | Status: AC
  Administered 2022-07-30: 2 g via INTRAVENOUS
  Filled 2022-07-30: qty 100

## 2022-07-30 MED ORDER — METOCLOPRAMIDE HCL 5 MG/ML IJ SOLN: 5.0000 mg | Freq: Three times a day (TID) | INTRAMUSCULAR | Status: DC | PRN

## 2022-07-30 MED ORDER — ACETAMINOPHEN 10 MG/ML IV SOLN
INTRAVENOUS | Status: AC
  Filled 2022-07-30: qty 100

## 2022-07-30 MED ORDER — FENTANYL CITRATE (PF) 250 MCG/5ML IJ SOLN
INTRAMUSCULAR | Status: AC
  Filled 2022-07-30: qty 5

## 2022-07-30 MED ORDER — METOCLOPRAMIDE HCL 5 MG PO TABS: 5.0000 mg | ORAL_TABLET | Freq: Three times a day (TID) | ORAL | Status: DC | PRN

## 2022-07-30 MED ORDER — PHENYLEPHRINE 80 MCG/ML (10ML) SYRINGE FOR IV PUSH (FOR BLOOD PRESSURE SUPPORT)
PREFILLED_SYRINGE | INTRAVENOUS | Status: DC | PRN
  Administered 2022-07-30: 160 ug via INTRAVENOUS

## 2022-07-30 SURGICAL SUPPLY — 37 items
ABDOMINAL PAD ABD IMPLANT
BAG COUNTER SPONGE SURGICOUNT (BAG) IMPLANT
BLADE SURG 21 STRL SS (BLADE) ×1 IMPLANT
BNDG COHESIVE 4X5 TAN ST LF (GAUZE/BANDAGES/DRESSINGS) IMPLANT
BNDG COHESIVE 6X5 TAN STRL LF (GAUZE/BANDAGES/DRESSINGS) IMPLANT
BNDG GAUZE DERMACEA FLUFF 4 (GAUZE/BANDAGES/DRESSINGS) ×2 IMPLANT
BNDG GZE DERMACEA 4 6PLY (GAUZE/BANDAGES/DRESSINGS) ×2
COVER SURGICAL LIGHT HANDLE (MISCELLANEOUS) ×2 IMPLANT
DRAPE U-SHAPE 47X51 STRL (DRAPES) ×1 IMPLANT
DRESSING MEPILEX FLEX 4X4 (GAUZE/BANDAGES/DRESSINGS) IMPLANT
DRSG ADAPTIC 3X8 NADH LF (GAUZE/BANDAGES/DRESSINGS) ×1 IMPLANT
DRSG MEPILEX BORDER 4X12 (GAUZE/BANDAGES/DRESSINGS) IMPLANT
DRSG MEPILEX FLEX 4X4 (GAUZE/BANDAGES/DRESSINGS) ×1
DURAPREP 26ML APPLICATOR (WOUND CARE) ×1 IMPLANT
ELECT REM PT RETURN 9FT ADLT (ELECTROSURGICAL)
ELECTRODE REM PT RTRN 9FT ADLT (ELECTROSURGICAL) IMPLANT
GAUZE SPONGE 4X4 12PLY STRL (GAUZE/BANDAGES/DRESSINGS) ×1 IMPLANT
GLOVE BIOGEL PI IND STRL 9 (GLOVE) ×1 IMPLANT
GLOVE BIOGEL PI INDICATOR 9 (GLOVE) ×1
GLOVE SURG ORTHO 9.0 STRL STRW (GLOVE) ×1 IMPLANT
GOWN STRL REUS W/ TWL XL LVL3 (GOWN DISPOSABLE) ×2 IMPLANT
GOWN STRL REUS W/TWL XL LVL3 (GOWN DISPOSABLE) ×2
HANDPIECE INTERPULSE COAX TIP (DISPOSABLE)
KIT BASIN OR (CUSTOM PROCEDURE TRAY) ×1 IMPLANT
KIT TURNOVER KIT B (KITS) ×1 IMPLANT
MANIFOLD NEPTUNE II (INSTRUMENTS) ×1 IMPLANT
NS IRRIG 1000ML POUR BTL (IV SOLUTION) ×1 IMPLANT
PACK ORTHO EXTREMITY (CUSTOM PROCEDURE TRAY) ×1 IMPLANT
PAD ARMBOARD 7.5X6 YLW CONV (MISCELLANEOUS) ×2 IMPLANT
SET HNDPC FAN SPRY TIP SCT (DISPOSABLE) IMPLANT
STOCKINETTE IMPERVIOUS 9X36 MD (GAUZE/BANDAGES/DRESSINGS) IMPLANT
SUT ETHILON 2 0 PSLX (SUTURE) ×1 IMPLANT
SWAB COLLECTION DEVICE MRSA (MISCELLANEOUS) ×1 IMPLANT
SWAB CULTURE ESWAB REG 1ML (MISCELLANEOUS) IMPLANT
TOWEL GREEN STERILE (TOWEL DISPOSABLE) ×1 IMPLANT
TUBE CONNECTING 12X1/4 (SUCTIONS) ×1 IMPLANT
YANKAUER SUCT BULB TIP NO VENT (SUCTIONS) ×1 IMPLANT

## 2022-07-30 NOTE — Progress Notes (Signed)
Pt arrived on unit from PACU... requesting pain meds now, assessed, informed of POC, meds given per [facility], all needs addressed at this time

## 2022-07-30 NOTE — Op Note (Signed)
07/20/2022 - 07/30/2022  6:13 PM  PATIENT:  James Baker    PRE-OPERATIVE DIAGNOSIS:  Abscess Right Forearm  POST-OPERATIVE DIAGNOSIS:  Same  PROCEDURE:  RIGHT FOREARM DEBRIDEMENT  SURGEON:  Nadara Mustard, MD  PHYSICIAN ASSISTANT:None ANESTHESIA:   General  PREOPERATIVE INDICATIONS:  [name] is a  46 y.o. male with a diagnosis of Abscess Right Forearm who failed conservative measures and elected for surgical management.    The risks benefits and alternatives were discussed with the patient preoperatively including but not limited to the risks of infection, bleeding, nerve injury, cardiopulmonary complications, the need for revision surgery, among others, and the patient was willing to proceed.  OPERATIVE IMPLANTS: none  @ENCIMAGES @  OPERATIVE FINDINGS: Patient had a separate abscess on the ulnar border of the forearm that was unique and separate from his previous upper arm necrotizing fasciitis.  Purulence and tissue sent for cultures.  OPERATIVE PROCEDURE: Patient was brought the operating room and underwent a general anesthetic.  After adequate levels anesthesia obtained patient's right upper extremity was prepped using DuraPrep draped into a sterile field a timeout was called.  An incision was made along the ulnar border of the mid forearm.  There is a large purulent abscess this was decompressed debrided.  Fascia was excised and this was sent for cultures.  The wound was irrigated with normal saline.  The remaining wound bed had healthy muscle no signs of any undermining with infection.  The wound was loosely closed with 2-0 nylon a sterile dressing was applied patient was extubated taken the PACU in stable condition   DISCHARGE PLANNING:  Antibiotic duration: Anticipate patient could discharge on antibiotics.  Weightbearing: Not applicable  Pain medication: Continue current pain medicine  Dressing care/ Wound VAC: Dry dressing right forearm  Ambulatory devices:  Not applicable  Discharge to: Anticipate discharge to home.  I will follow-up in the office as an outpatient.  Follow-up: In the office 1 week post operative.

## 2022-07-30 NOTE — Interval H&P Note (Signed)
History and Physical Interval Note:  07/30/2022 6:56 AM  James Baker  has presented today for surgery, with the diagnosis of Abscess Right Forearm.  The various methods of treatment have been discussed with the patient and family. After consideration of risks, benefits and other options for treatment, the patient has consented to  Procedure(s): RIGHT FOREARM DEBRIDEMENT (Right) as a surgical intervention.  The patient's history has been reviewed, patient examined, no change in status, stable for surgery.  I have reviewed the patient's chart and labs.  Questions were answered to the patient's satisfaction.     Nadara Mustard

## 2022-07-30 NOTE — Progress Notes (Signed)
Subjective:  No new complaints   Antibiotics:  Anti-infectives (From admission, onward)    Start     Dose/Rate Route Frequency Ordered Stop   07/30/22 0815  ceFAZolin (ANCEF) IVPB 2g/100 mL premix        2 g 200 mL/hr over 30 Minutes Intravenous On call to O.R. 07/30/22 0723 07/31/22 0559   07/25/22 1000  ceFAZolin (ANCEF) IVPB 2g/100 mL premix        2 g 200 mL/hr over 30 Minutes Intravenous On call to O.R. 07/25/22 0947 07/25/22 1054   07/25/22 0936  ceFAZolin (ANCEF) 2-4 GM/100ML-% IVPB       Note to Pharmacy: Shanda Bumps M: cabinet override      07/25/22 0936 07/25/22 1053   07/24/22 1600  Ampicillin-Sulbactam (UNASYN) 3 g in sodium chloride 0.9 % 100 mL IVPB        3 g 200 mL/hr over 30 Minutes Intravenous Every 6 hours 07/24/22 1427     07/23/22 1430  ceFAZolin (ANCEF) IVPB 2g/100 mL premix        2 g 200 mL/hr over 30 Minutes Intravenous On call to O.R. 07/22/22 1910 07/23/22 1737   07/22/22 2200  linezolid (ZYVOX) IVPB 600 mg        600 mg 300 mL/hr over 60 Minutes Intravenous Every 12 hours 07/22/22 1354 07/25/22 2359   07/22/22 1445  piperacillin-tazobactam (ZOSYN) IVPB 3.375 g  Status:  Discontinued        3.375 g 12.5 mL/hr over 240 Minutes Intravenous Every 8 hours 07/22/22 1353 07/24/22 1427   07/21/22 2200  vancomycin (VANCOREADY) IVPB 750 mg/150 mL  Status:  Discontinued       See Hyperspace for full Linked Orders Report.   750 mg 150 mL/hr over 60 Minutes Intravenous Every 12 hours 07/21/22 1457 07/22/22 1354   07/21/22 1458  cefTRIAXone (ROCEPHIN) 2 g in sodium chloride 0.9 % 100 mL IVPB  Status:  Discontinued        2 g 200 mL/hr over 30 Minutes Intravenous Daily 07/21/22 1458 07/22/22 0742   07/21/22 0615  vancomycin (VANCOREADY) IVPB 750 mg/150 mL  Status:  Discontinued       See Hyperspace for full Linked Orders Report.   750 mg 150 mL/hr over 60 Minutes Intravenous Every 12 hours 07/20/22 1805 07/21/22 1457   07/20/22 1815  vancomycin  (VANCOREADY) IVPB 1250 mg/250 mL       See Hyperspace for full Linked Orders Report.   1,250 mg 166.7 mL/hr over 90 Minutes Intravenous  Once 07/20/22 1805 07/20/22 2015   07/20/22 1800  metroNIDAZOLE (FLAGYL) IVPB 500 mg  Status:  Discontinued        500 mg 100 mL/hr over 60 Minutes Intravenous Every 12 hours 07/20/22 1749 07/22/22 1353   07/20/22 1615  cefTRIAXone (ROCEPHIN) 2 g in sodium chloride 0.9 % 100 mL IVPB  Status:  Discontinued        2 g 200 mL/hr over 30 Minutes Intravenous Every 24 hours 07/20/22 1613 07/21/22 1458       Medications: Scheduled Meds:  acetaminophen  1,000 mg Oral TID   docusate sodium  100 mg Oral BID   enoxaparin (LOVENOX) injection  40 mg Subcutaneous Q24H   nicotine  14 mg Transdermal Daily   povidone-iodine  2 Application Topical Once   Continuous Infusions:  sodium chloride     sodium chloride     ampicillin-sulbactam (UNASYN) IV 3 g (07/30/22 0359)  ceFAZolin (ANCEF) IV     methocarbamol (ROBAXIN) IV     PRN Meds:.acetaminophen, albuterol, bisacodyl, HYDROmorphone (DILAUDID) injection, melatonin, methocarbamol **OR** methocarbamol (ROBAXIN) IV, metoCLOPramide **OR** metoCLOPramide (REGLAN) injection, morphine injection, ondansetron **OR** ondansetron (ZOFRAN) IV, oxyCODONE, oxyCODONE, oxyCODONE, polyethylene glycol, simethicone    Objective: Weight change:   Intake/Output Summary (Last 24 hours) at 07/30/2022 0830 Last data filed at 07/30/2022 0359 Gross per 24 hour  Intake 1211 ml  Output 1125 ml  Net 86 ml    Blood pressure 118/77, pulse 85, temperature 97.8 F (36.6 C), temperature source Oral, resp. rate 16, height 5\' 10"  (1.778 m), weight 61.2 kg, SpO2 99 %. Temp:  [97.8 F (36.6 C)-98.4 F (36.9 C)] 97.8 F (36.6 C) (08/30 0822) Pulse Rate:  [73-85] 85 (08/30 0822) Resp:  [16-18] 16 (08/30 0822) BP: (118-137)/(77-90) 118/77 (08/30 0822) SpO2:  [95 %-100 %] 99 % (08/30 05-23-1999)  Physical Exam: Physical  Exam Constitutional:      Appearance: He is well-developed.  HENT:     Head: Normocephalic and atraumatic.  Eyes:     Conjunctiva/sclera: Conjunctivae normal.  Cardiovascular:     Rate and Rhythm: Normal rate and regular rhythm.  Pulmonary:     Effort: Pulmonary effort is normal. No respiratory distress.     Breath sounds: Normal breath sounds. No stridor. No wheezing.  Abdominal:     General: There is no distension.     Palpations: Abdomen is soft.  Musculoskeletal:     Cervical back: Normal range of motion and neck supple.  Skin:    Findings: No erythema or rash.  Neurological:     General: No focal deficit present.     Mental Status: He is alert and oriented to person, place, and time.  Psychiatric:        Mood and Affect: Mood normal.        Behavior: Behavior normal.        Thought Content: Thought content normal.        Judgment: Judgment normal.      With wound vacuum in place and shown forearm also with palpable abscess   CBC:    BMET Recent Labs    07/28/22 0441 07/29/22 0237  NA 139 138  K 3.7 4.1  CL 107 103  CO2 27 27  GLUCOSE 101* 93  BUN 15 19  CREATININE 0.84 1.07  CALCIUM 8.3* 9.0      Liver Panel  Recent Labs    07/28/22 0441 07/29/22 0237  PROT 5.7* 6.0*  ALBUMIN 2.7* 2.9*  AST 14* 16  ALT 16 15  ALKPHOS 42 45  BILITOT 0.3 <0.1*        Sedimentation Rate No results for input(s): "ESRSEDRATE" in the last 72 hours. C-Reactive Protein No results for input(s): "CRP" in the last 72 hours.  Micro Results: Recent Results (from the past 720 hour(s))  Resp Panel by RT-PCR (Flu A&B, Covid) Anterior Nasal Swab     Status: None   Collection Time: 07/20/22  4:13 PM   Specimen: Anterior Nasal Swab  Result Value Ref Range Status   SARS Coronavirus 2 by RT PCR NEGATIVE NEGATIVE Final    Comment: (NOTE) SARS-CoV-2 target nucleic acids are NOT DETECTED.  The SARS-CoV-2 RNA is generally detectable in upper respiratory specimens  during the acute phase of infection. The lowest concentration of SARS-CoV-2 viral copies this assay can detect is 138 copies/mL. A negative result does not preclude SARS-Cov-2 infection and should not be used  as the sole basis for treatment or other patient management decisions. A negative result may occur with  improper specimen collection/handling, submission of specimen other than nasopharyngeal swab, presence of viral mutation(s) within the areas targeted by this assay, and inadequate number of viral copies(<138 copies/mL). A negative result must be combined with clinical observations, patient history, and epidemiological information. The expected result is Negative.  Fact Sheet for Patients:  BloggerCourse.com  Fact Sheet for Healthcare Providers:  SeriousBroker.it  This test is no t yet approved or cleared by the [place] FDA and  has been authorized for detection and/or diagnosis of SARS-CoV-2 by FDA under an Emergency Use Authorization (EUA). This EUA will remain  in effect (meaning this test can be used) for the duration of the COVID-19 declaration under Section 564(b)(1) of the Act, 21 U.S.C.section 360bbb-3(b)(1), unless the authorization is terminated  or revoked sooner.       Influenza A by PCR NEGATIVE NEGATIVE Final   Influenza B by PCR NEGATIVE NEGATIVE Final    Comment: (NOTE) The Xpert Xpress SARS-CoV-2/FLU/RSV plus assay is intended as an aid in the diagnosis of influenza from Nasopharyngeal swab specimens and should not be used as a sole basis for treatment. Nasal washings and aspirates are unacceptable for Xpert Xpress SARS-CoV-2/FLU/RSV testing.  Fact Sheet for Patients: BloggerCourse.com  Fact Sheet for Healthcare Providers: SeriousBroker.it  This test is not yet approved or cleared by the [place] FDA and has been authorized for detection  and/or diagnosis of SARS-CoV-2 by FDA under an Emergency Use Authorization (EUA). This EUA will remain in effect (meaning this test can be used) for the duration of the COVID-19 declaration under Section 564(b)(1) of the Act, 21 U.S.C. section 360bbb-3(b)(1), unless the authorization is terminated or revoked.  Performed at [facility], [address] [place], [place] [address]   Blood Culture (routine x 2)     Status: None   Collection Time: 07/20/22  4:18 PM   Specimen: BLOOD LEFT FOREARM  Result Value Ref Range Status   Specimen Description   Final    BLOOD LEFT FOREARM BOTTLES DRAWN AEROBIC AND ANAEROBIC   Special Requests Blood Culture adequate volume  Final   Culture   Final    NO GROWTH 5 DAYS Performed at [facility], [address], [place], [place] [address]    Report Status 07/25/2022 FINAL  Final  Blood Culture (routine x 2)     Status: None   Collection Time: 07/20/22  4:33 PM   Specimen: Left Antecubital; Blood  Result Value Ref Range Status   Specimen Description   Final    LEFT ANTECUBITAL BOTTLES DRAWN AEROBIC AND ANAEROBIC   Special Requests Blood Culture adequate volume  Final   Culture   Final    NO GROWTH 5 DAYS Performed at [facility], [address] [place], [place] [address]    Report Status 07/25/2022 FINAL  Final  MRSA Next Gen by PCR, Nasal     Status: None   Collection Time: 07/21/22 12:54 AM   Specimen: Nasal Mucosa; Nasal Swab  Result Value Ref Range Status   MRSA by PCR Next Gen NOT DETECTED NOT DETECTED Final    Comment: (NOTE) The GeneXpert MRSA Assay (FDA approved for NASAL specimens only), is one component of a comprehensive MRSA colonization surveillance program. It is not intended to diagnose MRSA infection nor to guide or monitor treatment for MRSA infections. Test performance is not FDA approved in patients less than 2 years  old. Performed at [facility], [address] [place], [place] [address]   Fungus  Culture With Stain     Status: None (Preliminary result)   Collection Time: 07/21/22 10:23 AM   Specimen: PATH Other; Tissue  Result Value Ref Range Status   Fungus Stain Final report  Final    Comment: (NOTE) Performed At: [facility] [address] [place], [place] [id number] Jolene SchimkeNagendra Sanjai MD LK:[id number]:[phone]    Fungus (Mycology) Culture PENDING  Incomplete   Fungal Source ABSCESS  Final    Comment: Performed at [facility], [address] [place], [place] [address]  Aerobic/Anaerobic Culture w Gram Stain (surgical/deep wound)     Status: None (Preliminary result)   Collection Time: 07/21/22 10:23 AM   Specimen: PATH Other; Tissue  Result Value Ref Range Status   Specimen Description ABSCESS  Final   Special Requests  RIGHT UPPER ARM  Final   Gram Stain   Final    MODERATE WBC PRESENT,BOTH PMN AND MONONUCLEAR MODERATE GRAM NEGATIVE RODS MODERATE GRAM POSITIVE COCCI IN PAIRS AND CHAINS FEW GRAM POSITIVE RODS    Culture   Final    MODERATE STREPTOCOCCUS CONSTELLATUS Sent to [facility] for further susceptibility testing. FEW ACTINOMYCES ODONTOLYTICUS Standardized susceptibility testing for this organism is not available. MODERATE BACTEROIDES ORALIS BETA LACTAMASE POSITIVE Performed at [facility], [address] [place], [place] [address]    Report Status PENDING  Incomplete  Acid Fast Smear (AFB)     Status: None   Collection Time: 07/21/22 10:23 AM   Specimen: PATH Other; Tissue  Result Value Ref Range Status   AFB Specimen Processing Concentration  Final   Acid Fast Smear Negative  Final    Comment: (NOTE) Performed At: [facility] [place] [address] [place], [place] [id number] Jolene SchimkeNagendra Sanjai MD EP:[id number]:[phone]    Source (AFB) ABSCESS  Final    Comment: Performed at [facility], [address] [place], [place] [address]  Fungus Culture Result     Status: None   Collection Time: 07/21/22 10:23 AM  Result Value Ref Range Status    Result 1 Comment  Final    Comment: (NOTE) KOH/Calcofluor preparation:  no fungus observed. Performed At: [facility] [place] [address] [place], [place] [id number] Jolene SchimkeNagendra Sanjai MD FT:[id number]:[phone]   Susceptibility, Aer + Anaerob     Status: Abnormal   Collection Time: 07/21/22 10:23 AM  Result Value Ref Range Status   Suscept, Aer + Anaerob Preliminary report (A)  Final    Comment: (NOTE) Performed At: [facility] [place] [address] [place], [place] [id number] Jolene SchimkeNagendra Sanjai MD TD:[id number]:[phone]    Source of Sample   Final    STREP.CONSTELLATUS SUSCEPTIBILITY ABSCESS RIGHT UPPER ARM    Comment: Performed at [facility], [address] [place], [place] [address]  Susceptibility Result     Status: Abnormal   Collection Time: 07/21/22 10:23 AM  Result Value Ref Range Status   Suscept Result 1 Comment (A)  Final    Comment: (NOTE) Streptococcus constellatus Identification performed by account, not confirmed by this laboratory. Performed At: [facility] [place] [address] [place], [place] [id number] Jolene SchimkeNagendra Sanjai MD JK:[id number]:[phone]   Fungus Culture With Stain     Status: None (Preliminary result)   Collection Time: 07/23/22  5:44 PM   Specimen: Soft Tissue, Other  Result Value Ref Range Status   Fungus Stain Final report  Final    Comment: (NOTE)  Performed At: [facility] [address] [place], [place] [id number] Jolene Schimke MD QM:[id number]    Fungus (Mycology) Culture PENDING  Incomplete   Fungal Source TISSUE  Final    Comment: Performed at [facility], [address], [place], [place] [address]  Aerobic/Anaerobic Culture w Gram Stain (surgical/deep wound)     Status: None   Collection Time: 07/23/22  5:44 PM   Specimen: Soft Tissue, Other  Result Value Ref Range Status   Specimen Description TISSUE  Final   Special Requests RIGHT ARM FASCIA  Final   Gram Stain NO ORGANISMS SEEN NO WBC SEEN   Final   Culture   Final    No  growth aerobically or anaerobically. Performed at [facility], [address] [place], [place] [address]    Report Status 07/28/2022 FINAL  Final  Acid Fast Smear (AFB)     Status: None   Collection Time: 07/23/22  5:44 PM   Specimen: Soft Tissue, Other  Result Value Ref Range Status   AFB Specimen Processing Concentration  Final   Acid Fast Smear Negative  Final    Comment: (NOTE) Performed At: [facility] [address] [place], [place] [id number] Jolene Schimke MD ZP:[id number]    Source (AFB) TISSUE  Final    Comment: Performed at [facility], [address] [place], [place] [address]  Fungus Culture Result     Status: None   Collection Time: 07/23/22  5:44 PM  Result Value Ref Range Status   Result 1 Comment  Final    Comment: (NOTE) KOH/Calcofluor preparation:  no fungus observed. Performed At: [facility] [address] [place], [place] [address] Jolene Schimke MD EM:[id number]     Studies/Results: No results found.    Assessment/Plan:  INTERVAL HISTORY:   Pt to go to the OR today   Principal Problem:   Necrotizing cellulitis of right arm Active Problems:   Severe sepsis (HCC)   Hyponatremia   Tobacco abuse   Cellulitis of right upper extremity    Ashad Fawbush Mihalik is a 46 y.o. male with  hx of alcohol abuse, who was apparently performing tattoo on himself and has developed necrotizing fasciitis of his upper extremity with curious constellation of bacteria including Streptococcus constellatus actinomyces and Bacteroides oralis isolated on culture after I and D.   CT of the forearm on August 27 shows an additional abscess in the forearm.  #1  Progressing necrotizing fascitis and new upper arm and tattoo site and now also abscess forearm  --greatly appreciate Dr. Lajoyce Corners and Dr. Doristine Church vigilance on this patient    Continue Unasyn.  Follow-up culture data from the OR  Given actinomyces species will need protracted  amoxicillin therapy     LOS: 10 days   Acey Lav 07/30/2022, 8:30 AM

## 2022-07-30 NOTE — Anesthesia Procedure Notes (Signed)
Procedure Name: LMA Insertion Date/Time: 07/30/2022 5:56 PM  Performed by: Lynnell Chad, CRNAPre-anesthesia Checklist: Patient identified, Emergency Drugs available, Suction available and Patient being monitored Patient Re-evaluated:Patient Re-evaluated prior to induction Oxygen Delivery Method: Circle System Utilized Preoxygenation: Pre-oxygenation with 100% oxygen Induction Type: IV induction Ventilation: Mask ventilation without difficulty LMA: LMA inserted LMA Size: 4.0 Number of attempts: 1 Airway Equipment and Method: Bite block Placement Confirmation: positive ETCO2 Tube secured with: Tape Dental Injury: Teeth and Oropharynx as per pre-operative assessment

## 2022-07-30 NOTE — Progress Notes (Signed)
Mobility Specialist - Progress Note   07/30/22 1021  Mobility  Activity Refused mobility      Pt declined mobility d/t expected surgery today. Also stated he was not feeling well enough to walk. Left in bed w/ call bell in reach and all needs met. Will follow up as time allows.   Paulla Dolly Mobility Specialist

## 2022-07-30 NOTE — Anesthesia Preprocedure Evaluation (Signed)
Anesthesia Evaluation    Reviewed: Allergy & Precautions, Patient's Chart, lab work & pertinent test results, Unable to perform ROS - Chart review only  History of Anesthesia Complications Negative for: history of anesthetic complications  Airway Mallampati: I  TM Distance: >3 FB Neck ROM: Limited    Dental  (+) Missing, Loose, Dental Advisory Given   Pulmonary asthma , COPD,  COPD inhaler, Current Smoker and Patient abstained from smoking.,    breath sounds clear to auscultation       Cardiovascular (-) anginanegative cardio ROS   Rhythm:Regular Rate:Normal     Neuro/Psych negative neurological ROS  negative psych ROS   GI/Hepatic negative GI ROS, (+)     substance abuse (UDS positive for amphetamines, benzos, and THC)  alcohol use and marijuana use,   Endo/Other  negative endocrine ROS  Renal/GU Na 130  negative genitourinary   Musculoskeletal   Abdominal   Peds  Hematology negative hematology ROS (+)   Anesthesia Other Findings sepsis secondary to right upper extremity necrotizing abscess/cellulitis  Reproductive/Obstetrics                             Anesthesia Physical  Anesthesia Plan  ASA: 3  Anesthesia Plan: General   Post-op Pain Management: Tylenol PO (pre-op)* and Toradol IV (intra-op)*   Induction: Intravenous  PONV Risk Score and Plan: 1 and Treatment may vary due to age or medical condition, Midazolam, Dexamethasone and Ondansetron  Airway Management Planned: LMA  Additional Equipment: None  Intra-op Plan:   Post-operative Plan:   Informed Consent:   Plan Discussed with:   Anesthesia Plan Comments:         Anesthesia Quick Evaluation

## 2022-07-30 NOTE — Progress Notes (Signed)
Mobility Specialist - Progress Note   07/30/22 1426  Mobility  Activity Ambulated independently in hallway  Level of Assistance Independent  Assistive Device None  Distance Ambulated (ft) 200 ft  Activity Response Tolerated well  $Mobility charge 1 Mobility    Pt received in bed and agreeable to mobility. No complaints throughout. Left in bed w/ call bell within reach and all needs met.   Paulla Dolly Mobility Specialist

## 2022-07-30 NOTE — Transfer of Care (Signed)
Immediate Anesthesia Transfer of Care Note  Patient: James Baker  Procedure(s) Performed: RIGHT FOREARM DEBRIDEMENT (Right)  Patient Location: PACU  Anesthesia Type:General  Level of Consciousness: drowsy and patient cooperative  Airway & Oxygen Therapy: Patient Spontanous Breathing  Post-op Assessment: Report given to RN and Post -op Vital signs reviewed and stable  Post vital signs: Reviewed and stable  Last Vitals:  Vitals Value Taken Time  BP 118/71 07/30/22 1822  Temp    Pulse 108 07/30/22 1826  Resp 19 07/30/22 1826  SpO2 99 % 07/30/22 1826  Vitals shown include unvalidated device data.  Last Pain:  Vitals:   07/30/22 1645  TempSrc: Oral  PainSc: 6       Patients Stated Pain Goal: 0 (07/27/22 1614)  Complications: No notable events documented.

## 2022-07-31 ENCOUNTER — Encounter (HOSPITAL_COMMUNITY): Payer: Self-pay | Admitting: Orthopedic Surgery

## 2022-07-31 ENCOUNTER — Telehealth (HOSPITAL_COMMUNITY): Payer: Self-pay | Admitting: Pharmacy Technician

## 2022-07-31 ENCOUNTER — Other Ambulatory Visit (HOSPITAL_COMMUNITY): Payer: Self-pay

## 2022-07-31 DIAGNOSIS — L0390 Cellulitis, unspecified: Secondary | ICD-10-CM | POA: Diagnosis not present

## 2022-07-31 DIAGNOSIS — L03113 Cellulitis of right upper limb: Secondary | ICD-10-CM | POA: Diagnosis not present

## 2022-07-31 DIAGNOSIS — A419 Sepsis, unspecified organism: Secondary | ICD-10-CM | POA: Diagnosis not present

## 2022-07-31 DIAGNOSIS — Z720 Tobacco use: Secondary | ICD-10-CM | POA: Diagnosis not present

## 2022-07-31 LAB — COMPREHENSIVE METABOLIC PANEL
ALT: 17 U/L (ref 0–44)
AST: 21 U/L (ref 15–41)
Albumin: 3.3 g/dL — ABNORMAL LOW (ref 3.5–5.0)
Alkaline Phosphatase: 50 U/L (ref 38–126)
Anion gap: 8 (ref 5–15)
BUN: 26 mg/dL — ABNORMAL HIGH (ref 6–20)
CO2: 26 mmol/L (ref 22–32)
Calcium: 9 mg/dL (ref 8.9–10.3)
Chloride: 103 mmol/L (ref 98–111)
Creatinine, Ser: 0.98 mg/dL (ref 0.61–1.24)
GFR, Estimated: >60 mL/min (ref >60)
Glucose, Bld: 98 mg/dL (ref 70–99)
Potassium: 4.3 mmol/L (ref 3.5–5.1)
Sodium: 137 mmol/L (ref 135–145)
Total Bilirubin: 0.5 mg/dL (ref 0.3–1.2)
Total Protein: 6.7 g/dL (ref 6.5–8.1)

## 2022-07-31 LAB — CBC WITH DIFFERENTIAL/PLATELET
Abs Immature Granulocytes: 0 10*3/uL (ref 0.00–0.07)
Basophils Absolute: 0.4 10*3/uL — ABNORMAL HIGH (ref 0.0–0.1)
Basophils Relative: 2 %
Eosinophils Absolute: 0.4 10*3/uL (ref 0.0–0.5)
Eosinophils Relative: 2 %
HCT: 37.4 % — ABNORMAL LOW (ref 39.0–52.0)
Hemoglobin: 11.9 g/dL — ABNORMAL LOW (ref 13.0–17.0)
Lymphocytes Relative: 34 %
Lymphs Abs: 6.5 10*3/uL — ABNORMAL HIGH (ref 0.7–4.0)
MCH: 26.8 pg (ref 26.0–34.0)
MCHC: 31.8 g/dL (ref 30.0–36.0)
MCV: 84.2 fL (ref 80.0–100.0)
Monocytes Absolute: 0.8 10*3/uL (ref 0.1–1.0)
Monocytes Relative: 4 %
Neutro Abs: 11.1 10*3/uL — ABNORMAL HIGH (ref 1.7–7.7)
Neutrophils Relative %: 58 %
Platelets: 609 10*3/uL — ABNORMAL HIGH (ref 150–400)
RBC: 4.44 MIL/uL (ref 4.22–5.81)
RDW: 18.5 % — ABNORMAL HIGH (ref 11.5–15.5)
WBC: 19.1 10*3/uL — ABNORMAL HIGH (ref 4.0–10.5)
nRBC: 0 % (ref 0.0–0.2)
nRBC: 0 /100 WBC

## 2022-07-31 LAB — MAGNESIUM: Magnesium: 2.2 mg/dL (ref 1.7–2.4)

## 2022-07-31 MED ORDER — AMOXICILLIN-POT CLAVULANATE 875-125 MG PO TABS
1.0000 | ORAL_TABLET | Freq: Two times a day (BID) | ORAL | Status: DC
  Administered 2022-07-31 – 2022-08-01 (×2): 1 via ORAL
  Filled 2022-07-31 (×2): qty 1

## 2022-07-31 NOTE — Progress Notes (Signed)
PT Cancellation Note  Patient Details Name: CEPHAS REVARD MRN: [id number] DOB: 07-03-1976   Cancelled Treatment:    Reason Eval/Treat Not Completed: PT screened, no needs identified, will sign off - per chart review and after discussing with RN, pt is independent with mobility. PT to sign off, please reconsult if needed.   Marye Round, PT DPT Acute Rehabilitation Services Pager [phone]  Office [phone]    Truddie Coco 07/31/2022, 11:37 AM

## 2022-07-31 NOTE — Progress Notes (Signed)
Patient ID: James Baker, male   DOB: 1976-11-23, 46 y.o.   MRN: [id number] Patient's abscess of the forearm was decompressed and the margins were clear.  Anticipate patient could be discharged at this time on oral antibiotics and I will follow-up in the office in 1 week.

## 2022-07-31 NOTE — Progress Notes (Signed)
PROGRESS NOTE        PATIENT DETAILS Name: James Baker Age: 46 y.o. Sex: male Date of Birth: May 02, 1976 Admit Date: 07/20/2022 Admitting Physician Onnie Boer, MD [id number], No Pcp Per  Brief Summary: Patient is a 46 y.o.  male with history of tobacco, occasional cocaine/marijuana use-who gave himself a tattoo in the right forearm-presented with yellow/greenish discharge surrounding the tattoo area-subsequent pain and swelling of his right arm.  He was found to have necrotizing tissue infection with abscess formation-and subsequently admitted to the [facility] service.  See below for further details.   Significant events: 8/20>> admit to [facility] at [facility]-right arm necrotizing infection/abscess formation. 8/21>> transfer to [facility] irrigation and debridement by orthopedics. 8/22>> transfer to [facility]  Significant studies: 8/20>> CT right humerus: Large enhancing abscess with gas-involving deltoid/biceps muscles.  Significant microbiology data: 8/20>> COVID/influenza PCR: Negative 8/20>> blood culture: No growth 8/21>> intraoperative cultures-right arm: Streptococcus constellatus, few actinomyces-microbiology lab holding for possible anaerobe. 8/23>> intraoperative culture-right arm: Negative  Procedures: 8/21>> irrigation/debridement-right arm-by orthopedics at [facility]. 8/23>> irrigation/debridement of right arm  Consults: Orthopedics. Infectious disease  Subjective:  Patient in bed, appears comfortable, denies any headache, no fever, no chest pain or pressure, no shortness of breath , no abdominal pain. No new focal weakness.    Objective: Vitals: Blood pressure (!) 119/91, pulse 92, temperature 98.5 F (36.9 C), temperature source Oral, resp. rate 12, height 5\' 10"  (1.778 m), weight 61.2 kg, SpO2 96 %.   Exam:  Awake Alert, No new F.N deficits, Normal affect [place].AT,PERRAL Supple Neck, No JVD,   Symmetrical Chest wall movement, Good air  movement bilaterally, CTAB RRR,No Gallops, Rubs or new Murmurs,  +ve B.Sounds, Abd Soft, No tenderness,   Right arm and forearm under bandage   Assessment/Plan:  Severe sepsis with necrotizing soft tissue infection involving right arm with abscess formation: Afebrile-seen by orthopedics and ID, underwent multiple I&D procedures, currently has right arm wound VAC, on Unasyn per ID.  Follow final incision and drainage culture and sensitivity data.  Antibiotics per ID.  Mild swelling in the right mid forearm: Possible infected hematoma this was drained by Dr. on 07/30/2022, continue to monitor.  Follow intraoperative cultures.  Tobacco/cocaine/marijuana use: Counseled-denies IVDA.  BMI: Estimated body mass index is 19.37 kg/m as calculated from the following:   Height as of this encounter: 5\' 10"  (1.778 m).   Weight as of this encounter: 61.2 kg.   Code status:   Code Status: Full Code   DVT Prophylaxis: Start on Lovenox daily SCDs Start: 07/30/22 1953 enoxaparin (LOVENOX) injection 40 mg Start: 07/27/22 1100 SCDs Start: 07/25/22 1213 SCDs Start: 07/23/22 1927   Family Communication: None at bedside today.   Disposition Plan: Status is: Inpatient Remains inpatient appropriate because: Necrotizing infection of right arm-s/p I&D x3-overall improved-awaiting final cultures from 8/21 to be speciated before consideration of discharge.   Planned Discharge Destination:Home   Diet: Diet Order             Diet regular Room service appropriate? Yes; Fluid consistency: Thin  Diet effective now                   MEDICATIONS: Scheduled Meds:  acetaminophen  1,000 mg Oral TID   docusate sodium  100 mg Oral BID   enoxaparin (LOVENOX) injection  40 mg Subcutaneous Q24H  nicotine  14 mg Transdermal Daily   Continuous Infusions:  sodium chloride     sodium chloride     sodium chloride 10 mL/hr at 07/30/22 2202   ampicillin-sulbactam (UNASYN) IV 3 g (07/31/22 0820)    methocarbamol (ROBAXIN) IV     methocarbamol (ROBAXIN) IV     PRN Meds:.acetaminophen, albuterol, bisacodyl, HYDROmorphone (DILAUDID) injection, magnesium citrate, melatonin, [DISCONTINUED] methocarbamol **OR** methocarbamol (ROBAXIN) IV, methocarbamol **OR** methocarbamol (ROBAXIN) IV, metoCLOPramide **OR** metoCLOPramide (REGLAN) injection, morphine injection, ondansetron **OR** ondansetron (ZOFRAN) IV, oxyCODONE, oxyCODONE, oxyCODONE, polyethylene glycol, simethicone   I have personally reviewed following labs and imaging studies  LABORATORY DATA:  Recent Labs  Lab 07/27/22 0149 07/28/22 0441 07/29/22 0237 07/30/22 1022 07/31/22 0352  WBC 16.6* 11.6* 13.2* 14.8* 19.1*  HGB 10.0* 9.8* 10.5* 12.9* 11.9*  HCT 32.1* 31.7* 33.8* 39.7 37.4*  PLT 529* 548* 600* 682* 609*  MCV 86.1 86.6 86.2 82.9 84.2  MCH 26.8 26.8 26.8 26.9 26.8  MCHC 31.2 30.9 31.1 32.5 31.8  RDW 17.0* 17.4* 17.7* 18.6* 18.5*  LYMPHSABS 7.3* 5.3* 6.1* 3.7 6.5*  MONOABS 1.3* 0.9 0.9 0.6 0.8  EOSABS 0.7* 0.7* 1.1* 0.6* 0.4  BASOSABS 0.0 0.1 0.1 0.0 0.4*    Recent Labs  Lab 07/27/22 0149 07/28/22 0441 07/29/22 0237 07/29/22 0932 07/30/22 1022 07/31/22 0352  NA 143 139 138  --  139 137  K 4.2 3.7 4.1  --  4.6 4.3  CL 107 107 103  --  102 103  CO2 30 27 27   --  28 26  GLUCOSE 85 101* 93  --  106* 98  BUN 13 15 19   --  21* 26*  CREATININE 0.94 0.84 1.07  --  0.86 0.98  CALCIUM 8.6* 8.3* 9.0  --  9.7 9.0  AST 23 14* 16  --  18 21  ALT 19 16 15   --  17 17  ALKPHOS 47 42 45  --  49 50  BILITOT 0.3 0.3 <0.1*  --  0.3 0.5  ALBUMIN 2.8* 2.7* 2.9*  --  3.6 3.3*  MG 2.1 2.1 2.1  --  2.3 2.2  INR  --   --   --  1.0  --   --    RADIOLOGY STUDIES/RESULTS: No results found.   LOS: 11 days   Signature  M.D on 07/31/2022 at 9:23 AM   -  To page go to [email or web address]

## 2022-07-31 NOTE — Progress Notes (Signed)
Subjective:  No new complaints   Antibiotics:  Anti-infectives (From admission, onward)    Start     Dose/Rate Route Frequency Ordered Stop   07/31/22 1600  amoxicillin-clavulanate (AUGMENTIN) 875-125 MG per tablet 1 tablet        1 tablet Oral Every 12 hours 07/31/22 1055     07/30/22 0815  ceFAZolin (ANCEF) IVPB 2g/100 mL premix        2 g 200 mL/hr over 30 Minutes Intravenous On call to O.R. 07/30/22 0723 07/30/22 1757   07/25/22 1000  ceFAZolin (ANCEF) IVPB 2g/100 mL premix        2 g 200 mL/hr over 30 Minutes Intravenous On call to O.R. 07/25/22 0947 07/25/22 1054   07/25/22 0936  ceFAZolin (ANCEF) 2-4 GM/100ML-% IVPB       Note to Pharmacy: Shanda Bumps M: cabinet override      07/25/22 0936 07/25/22 1053   07/24/22 1600  Ampicillin-Sulbactam (UNASYN) 3 g in sodium chloride 0.9 % 100 mL IVPB  Status:  Discontinued        3 g 200 mL/hr over 30 Minutes Intravenous Every 6 hours 07/24/22 1427 07/31/22 1055   07/23/22 1430  ceFAZolin (ANCEF) IVPB 2g/100 mL premix        2 g 200 mL/hr over 30 Minutes Intravenous On call to O.R. 07/22/22 1910 07/23/22 1737   07/22/22 2200  linezolid (ZYVOX) IVPB 600 mg        600 mg 300 mL/hr over 60 Minutes Intravenous Every 12 hours 07/22/22 1354 07/25/22 2359   07/22/22 1445  piperacillin-tazobactam (ZOSYN) IVPB 3.375 g  Status:  Discontinued        3.375 g 12.5 mL/hr over 240 Minutes Intravenous Every 8 hours 07/22/22 1353 07/24/22 1427   07/21/22 2200  vancomycin (VANCOREADY) IVPB 750 mg/150 mL  Status:  Discontinued       See Hyperspace for full Linked Orders Report.   750 mg 150 mL/hr over 60 Minutes Intravenous Every 12 hours 07/21/22 1457 07/22/22 1354   07/21/22 1458  cefTRIAXone (ROCEPHIN) 2 g in sodium chloride 0.9 % 100 mL IVPB  Status:  Discontinued        2 g 200 mL/hr over 30 Minutes Intravenous Daily 07/21/22 1458 07/22/22 0742   07/21/22 0615  vancomycin (VANCOREADY) IVPB 750 mg/150 mL  Status:  Discontinued        See Hyperspace for full Linked Orders Report.   750 mg 150 mL/hr over 60 Minutes Intravenous Every 12 hours 07/20/22 1805 07/21/22 1457   07/20/22 1815  vancomycin (VANCOREADY) IVPB 1250 mg/250 mL       See Hyperspace for full Linked Orders Report.   1,250 mg 166.7 mL/hr over 90 Minutes Intravenous  Once 07/20/22 1805 07/20/22 2015   07/20/22 1800  metroNIDAZOLE (FLAGYL) IVPB 500 mg  Status:  Discontinued        500 mg 100 mL/hr over 60 Minutes Intravenous Every 12 hours 07/20/22 1749 07/22/22 1353   07/20/22 1615  cefTRIAXone (ROCEPHIN) 2 g in sodium chloride 0.9 % 100 mL IVPB  Status:  Discontinued        2 g 200 mL/hr over 30 Minutes Intravenous Every 24 hours 07/20/22 1613 07/21/22 1458       Medications: Scheduled Meds:  acetaminophen  1,000 mg Oral TID   amoxicillin-clavulanate  1 tablet Oral Q12H   docusate sodium  100 mg Oral BID   enoxaparin (LOVENOX) injection  40 mg  Subcutaneous Q24H   nicotine  14 mg Transdermal Daily   Continuous Infusions:  sodium chloride     sodium chloride     sodium chloride 10 mL/hr at 07/30/22 2202   methocarbamol (ROBAXIN) IV     methocarbamol (ROBAXIN) IV     PRN Meds:.acetaminophen, albuterol, bisacodyl, HYDROmorphone (DILAUDID) injection, magnesium citrate, melatonin, [DISCONTINUED] methocarbamol **OR** methocarbamol (ROBAXIN) IV, methocarbamol **OR** methocarbamol (ROBAXIN) IV, metoCLOPramide **OR** metoCLOPramide (REGLAN) injection, morphine injection, ondansetron **OR** ondansetron (ZOFRAN) IV, oxyCODONE, oxyCODONE, oxyCODONE, polyethylene glycol, simethicone    Objective: Weight change:   Intake/Output Summary (Last 24 hours) at 07/31/2022 1257 Last data filed at 07/30/2022 2204 Gross per 24 hour  Intake 300 ml  Output 300 ml  Net 0 ml    Blood pressure 124/81, pulse (!) 113, temperature 98.1 F (36.7 C), temperature source Oral, resp. rate 17, height 5\' 10"  (1.778 m), weight 61.2 kg, SpO2 98 %. Temp:  [97.9 F (36.6  C)-99.1 F (37.3 C)] 98.1 F (36.7 C) (08/31 1201) Pulse Rate:  [77-113] 113 (08/31 1201) Resp:  [10-18] 17 (08/31 1201) BP: (118-160)/(71-112) 124/81 (08/31 1201) SpO2:  [96 %-100 %] 98 % (08/31 1201)  Physical Exam: Physical Exam Constitutional:      Appearance: He is well-developed.  HENT:     Head: Normocephalic and atraumatic.  Eyes:     Conjunctiva/sclera: Conjunctivae normal.  Cardiovascular:     Rate and Rhythm: Normal rate and regular rhythm.  Pulmonary:     Effort: Pulmonary effort is normal. No respiratory distress.     Breath sounds: Normal breath sounds. No stridor. No wheezing.  Abdominal:     General: There is no distension.     Palpations: Abdomen is soft.  Musculoskeletal:        General: Normal range of motion.     Cervical back: Normal range of motion and neck supple.  Skin:    General: Skin is warm and dry.     Findings: No erythema or rash.  Neurological:     General: No focal deficit present.     Mental Status: He is alert and oriented to person, place, and time.  Psychiatric:        Mood and Affect: Mood normal.        Behavior: Behavior normal.        Thought Content: Thought content normal.        Judgment: Judgment normal.      Forearm with dressing  CBC:    BMET Recent Labs    07/30/22 1022 07/31/22 0352  NA 139 137  K 4.6 4.3  CL 102 103  CO2 28 26  GLUCOSE 106* 98  BUN 21* 26*  CREATININE 0.86 0.98  CALCIUM 9.7 9.0      Liver Panel  Recent Labs    07/30/22 1022 07/31/22 0352  PROT 7.5 6.7  ALBUMIN 3.6 3.3*  AST 18 21  ALT 17 17  ALKPHOS 49 50  BILITOT 0.3 0.5        Sedimentation Rate No results for input(s): "ESRSEDRATE" in the last 72 hours. C-Reactive Protein No results for input(s): "CRP" in the last 72 hours.  Micro Results: Recent Results (from the past 720 hour(s))  Resp Panel by RT-PCR (Flu A&B, Covid) Anterior Nasal Swab     Status: None   Collection Time: 07/20/22  4:13 PM   Specimen:  Anterior Nasal Swab  Result Value Ref Range Status   SARS Coronavirus 2 by RT PCR NEGATIVE NEGATIVE  Final    Comment: (NOTE) SARS-CoV-2 target nucleic acids are NOT DETECTED.  The SARS-CoV-2 RNA is generally detectable in upper respiratory specimens during the acute phase of infection. The lowest concentration of SARS-CoV-2 viral copies this assay can detect is 138 copies/mL. A negative result does not preclude SARS-Cov-2 infection and should not be used as the sole basis for treatment or other patient management decisions. A negative result may occur with  improper specimen collection/handling, submission of specimen other than nasopharyngeal swab, presence of viral mutation(s) within the areas targeted by this assay, and inadequate number of viral copies(<138 copies/mL). A negative result must be combined with clinical observations, patient history, and epidemiological information. The expected result is Negative.  Fact Sheet for Patients:  BloggerCourse.com  Fact Sheet for Healthcare Providers:  SeriousBroker.it  This test is no t yet approved or cleared by the [place] FDA and  has been authorized for detection and/or diagnosis of SARS-CoV-2 by FDA under an Emergency Use Authorization (EUA). This EUA will remain  in effect (meaning this test can be used) for the duration of the COVID-19 declaration under Section 564(b)(1) of the Act, 21 U.S.C.section 360bbb-3(b)(1), unless the authorization is terminated  or revoked sooner.       Influenza A by PCR NEGATIVE NEGATIVE Final   Influenza B by PCR NEGATIVE NEGATIVE Final    Comment: (NOTE) The Xpert Xpress SARS-CoV-2/FLU/RSV plus assay is intended as an aid in the diagnosis of influenza from Nasopharyngeal swab specimens and should not be used as a sole basis for treatment. Nasal washings and aspirates are unacceptable for Xpert Xpress SARS-CoV-2/FLU/RSV testing.  Fact  Sheet for Patients: BloggerCourse.com  Fact Sheet for Healthcare Providers: SeriousBroker.it  This test is not yet approved or cleared by the [place] FDA and has been authorized for detection and/or diagnosis of SARS-CoV-2 by FDA under an Emergency Use Authorization (EUA). This EUA will remain in effect (meaning this test can be used) for the duration of the COVID-19 declaration under Section 564(b)(1) of the Act, 21 U.S.C. section 360bbb-3(b)(1), unless the authorization is terminated or revoked.  Performed at [facility], [address], [place], [place] [address]   Blood Culture (routine x 2)     Status: None   Collection Time: 07/20/22  4:18 PM   Specimen: BLOOD LEFT FOREARM  Result Value Ref Range Status   Specimen Description   Final    BLOOD LEFT FOREARM BOTTLES DRAWN AEROBIC AND ANAEROBIC   Special Requests Blood Culture adequate volume  Final   Culture   Final    NO GROWTH 5 DAYS Performed at [facility], [address], [place], [place] [address]    Report Status 07/25/2022 FINAL  Final  Blood Culture (routine x 2)     Status: None   Collection Time: 07/20/22  4:33 PM   Specimen: Left Antecubital; Blood  Result Value Ref Range Status   Specimen Description   Final    LEFT ANTECUBITAL BOTTLES DRAWN AEROBIC AND ANAEROBIC   Special Requests Blood Culture adequate volume  Final   Culture   Final    NO GROWTH 5 DAYS Performed at [facility], [address] [place], [place] [address]    Report Status 07/25/2022 FINAL  Final  MRSA Next Gen by PCR, Nasal     Status: None   Collection Time: 07/21/22 12:54 AM   Specimen: Nasal Mucosa; Nasal Swab  Result Value Ref Range Status   MRSA by PCR Next Gen NOT DETECTED  NOT DETECTED Final    Comment: (NOTE) The GeneXpert MRSA Assay (FDA approved for NASAL specimens only), is one component of a comprehensive MRSA colonization surveillance program. It is not intended to  diagnose MRSA infection nor to guide or monitor treatment for MRSA infections. Test performance is not FDA approved in patients less than 5 years old. Performed at [facility], [address] [place], [place] [address]   Fungus Culture With Stain     Status: None (Preliminary result)   Collection Time: 07/21/22 10:23 AM   Specimen: PATH Other; Tissue  Result Value Ref Range Status   Fungus Stain Final report  Final    Comment: (NOTE) Performed At: [facility] [address] [place], [place] [id number] Jolene Schimke MD DG:[id number]    Fungus (Mycology) Culture PENDING  Incomplete   Fungal Source ABSCESS  Final    Comment: Performed at [facility], [address], [place], [place] [address]  Aerobic/Anaerobic Culture w Gram Stain (surgical/deep wound)     Status: None (Preliminary result)   Collection Time: 07/21/22 10:23 AM   Specimen: PATH Other; Tissue  Result Value Ref Range Status   Specimen Description ABSCESS  Final   Special Requests  RIGHT UPPER ARM  Final   Gram Stain   Final    MODERATE WBC PRESENT,BOTH PMN AND MONONUCLEAR MODERATE GRAM NEGATIVE RODS MODERATE GRAM POSITIVE COCCI IN PAIRS AND CHAINS FEW GRAM POSITIVE RODS    Culture   Final    MODERATE STREPTOCOCCUS CONSTELLATUS Sent to [facility] for further susceptibility testing. FEW ACTINOMYCES ODONTOLYTICUS Standardized susceptibility testing for this organism is not available. MODERATE BACTEROIDES ORALIS BETA LACTAMASE POSITIVE Performed at [facility], [address] [place], [place] [address]    Report Status PENDING  Incomplete  Acid Fast Smear (AFB)     Status: None   Collection Time: 07/21/22 10:23 AM   Specimen: PATH Other; Tissue  Result Value Ref Range Status   AFB Specimen Processing Concentration  Final   Acid Fast Smear Negative  Final    Comment: (NOTE) Performed At: [facility] [address] [place], [place] [id number] Jolene Schimke MD  AT:[id number]    Source (AFB) ABSCESS  Final    Comment: Performed at [facility], [address] [place], [place] [address]  Fungus Culture Result     Status: None   Collection Time: 07/21/22 10:23 AM  Result Value Ref Range Status   Result 1 Comment  Final    Comment: (NOTE) KOH/Calcofluor preparation:  no fungus observed. Performed At: [facility] [address] [place], [place] [id number] Jolene Schimke MD VO:[id number]   Susceptibility, Aer + Anaerob     Status: Abnormal   Collection Time: 07/21/22 10:23 AM  Result Value Ref Range Status   Suscept, Aer + Anaerob Preliminary report (A)  Final    Comment: (NOTE) Performed At: [facility] [address] [place], [place] [id number] Jolene Schimke MD OJ:[id number]    Source of Sample   Final    STREP.CONSTELLATUS SUSCEPTIBILITY ABSCESS RIGHT UPPER ARM    Comment: Performed at [facility], [address], [place], [place] [address]  Susceptibility Result     Status: Abnormal   Collection Time: 07/21/22 10:23 AM  Result Value Ref Range Status   Suscept Result 1 Comment (A)  Final    Comment: (NOTE) Streptococcus constellatus Identification performed by account, not confirmed by this laboratory. Performed At: [facility]  [address] [place], [place] [id number] Jolene Schimke MD AC:[id number]   Fungus Culture With Stain     Status: None (Preliminary result)   Collection Time: 07/23/22  5:44 PM   Specimen: Soft Tissue, Other  Result Value Ref Range Status   Fungus Stain Final report  Final    Comment: (NOTE) Performed At: [facility] [address] [place], [place] [id number] Jolene Schimke MD DU:[id number]    Fungus (Mycology) Culture PENDING  Incomplete   Fungal Source TISSUE  Final    Comment: Performed at [facility], [address], [place], [place] [address]  Aerobic/Anaerobic Culture w Gram Stain (surgical/deep wound)     Status: None   Collection Time:  07/23/22  5:44 PM   Specimen: Soft Tissue, Other  Result Value Ref Range Status   Specimen Description TISSUE  Final   Special Requests RIGHT ARM FASCIA  Final   Gram Stain NO ORGANISMS SEEN NO WBC SEEN   Final   Culture   Final    No growth aerobically or anaerobically. Performed at [facility], [address] [place], [place] [address]    Report Status 07/28/2022 FINAL  Final  Acid Fast Smear (AFB)     Status: None   Collection Time: 07/23/22  5:44 PM   Specimen: Soft Tissue, Other  Result Value Ref Range Status   AFB Specimen Processing Concentration  Final   Acid Fast Smear Negative  Final    Comment: (NOTE) Performed At: [facility] [address] [place], [place] [id number] Jolene Schimke MD GY:[id number]    Source (AFB) TISSUE  Final    Comment: Performed at [facility], [address] [place], [place] [address]  Fungus Culture Result     Status: None   Collection Time: 07/23/22  5:44 PM  Result Value Ref Range Status   Result 1 Comment  Final    Comment: (NOTE) KOH/Calcofluor preparation:  no fungus observed. Performed At: [facility] [address] [place], [place] [id number] Jolene Schimke MD JI:[id number]   Aerobic/Anaerobic Culture w Gram Stain (surgical/deep wound)     Status: None (Preliminary result)   Collection Time: 07/30/22  6:24 PM   Specimen: PATH Soft tissue  Result Value Ref Range Status   Specimen Description WOUND  Final   Special Requests RIGHT FOREARM  Final   Gram Stain NO WBC SEEN NO ORGANISMS SEEN   Final   Culture   Final    NO GROWTH < 24 HOURS Performed at [facility], [address], [place], [place] [address]    Report Status PENDING  Incomplete    Studies/Results: No results found.    Assessment/Plan:  INTERVAL HISTORY:   Back patient is status post I&D of forearm abscess   Principal Problem:   Necrotizing cellulitis of right arm Active Problems:   Severe sepsis (HCC)    Hyponatremia   Tobacco abuse   Cellulitis of right upper extremity    James Baker is a 46 y.o. male with  hx of alcohol abuse, who was apparently performing tattoo on himself and has developed necrotizing fasciitis of his upper extremity with curious constellation of bacteria including Streptococcus constellatus actinomyces and Bacteroides oralis isolated on culture after I and D.   CT of the forearm on August 27 shows an additional abscess in the forearm is now had I&D as well  #1  Progressive necrotizing fasciitis and now forearm abscess that has been appropriately  surgically debrided and controlled  We are switching him over to Augmentin and planning on having 2 weeks of postoperative Augmentin followed by likely at least 6 months of amoxicillin  We will make sure he has 2 weeks of Augmentin and 2 weeks of amoxicillin at discharge.  We will follow-up culture data   James Baker has an appointment on 08/20/2022 at 415 PM with Dr. Daiva Eves @  The [facility] is located in the [facility] at  [address] [place] in [place].  [address], which is located to the left of the elevators.  Phone: [phone]  Fax: [phone]  [email or web address]  Should arrive 30 minutes prior to his appointment.  I spent 36 minutes with the patient including than 50% of the time in face to face counseling of the patient egarding his necrotizing fasciitis forearm abscess along with review of medical records in preparation for the visit and during the visit and in coordination of his care.  I will sign off for now please call with further questions.    LOS: 11 days   Acey Lav 07/31/2022, 12:57 PM

## 2022-07-31 NOTE — Anesthesia Postprocedure Evaluation (Signed)
Anesthesia Post Note  Patient: James Baker  Procedure(s) Performed: RIGHT FOREARM DEBRIDEMENT (Right)     Patient location during evaluation: PACU Anesthesia Type: General Level of consciousness: awake and alert Pain management: pain level controlled Vital Signs Assessment: post-procedure vital signs reviewed and stable Respiratory status: spontaneous breathing Cardiovascular status: stable Anesthetic complications: no   No notable events documented.  Last Vitals:  Vitals:   07/31/22 1201 07/31/22 1258  BP: 124/81 (!) 134/104  Pulse: (!) 113 (!) 109  Resp: 17 15  Temp: 36.7 C 36.5 C  SpO2: 98% 100%    Last Pain:  Vitals:   07/31/22 1258  TempSrc: Oral  PainSc:                  [name]

## 2022-07-31 NOTE — Telephone Encounter (Signed)
Pharmacy Patient Advocate Encounter  Insurance verification completed.    The patient does not have prescription coverage  The patient is currently admitted and ran test claims for the following: Amoxicillin.  Copays and coinsurance results were relayed to Inpatient clinical team.

## 2022-08-01 ENCOUNTER — Other Ambulatory Visit (HOSPITAL_COMMUNITY): Payer: Self-pay

## 2022-08-01 DIAGNOSIS — L03113 Cellulitis of right upper limb: Secondary | ICD-10-CM

## 2022-08-01 DIAGNOSIS — R6520 Severe sepsis without septic shock: Secondary | ICD-10-CM

## 2022-08-01 DIAGNOSIS — Z720 Tobacco use: Secondary | ICD-10-CM | POA: Diagnosis not present

## 2022-08-01 DIAGNOSIS — L0390 Cellulitis, unspecified: Secondary | ICD-10-CM

## 2022-08-01 DIAGNOSIS — F1010 Alcohol abuse, uncomplicated: Secondary | ICD-10-CM

## 2022-08-01 DIAGNOSIS — A419 Sepsis, unspecified organism: Principal | ICD-10-CM

## 2022-08-01 LAB — BASIC METABOLIC PANEL
Anion gap: 9 (ref 5–15)
BUN: 18 mg/dL (ref 6–20)
CO2: 25 mmol/L (ref 22–32)
Calcium: 9.1 mg/dL (ref 8.9–10.3)
Chloride: 103 mmol/L (ref 98–111)
Creatinine, Ser: 0.85 mg/dL (ref 0.61–1.24)
GFR, Estimated: >60 mL/min (ref >60)
Glucose, Bld: 94 mg/dL (ref 70–99)
Potassium: 3.8 mmol/L (ref 3.5–5.1)
Sodium: 137 mmol/L (ref 135–145)

## 2022-08-01 LAB — CBC
HCT: 33.7 % — ABNORMAL LOW (ref 39.0–52.0)
Hemoglobin: 10.9 g/dL — ABNORMAL LOW (ref 13.0–17.0)
MCH: 27.1 pg (ref 26.0–34.0)
MCHC: 32.3 g/dL (ref 30.0–36.0)
MCV: 83.8 fL (ref 80.0–100.0)
Platelets: 541 10*3/uL — ABNORMAL HIGH (ref 150–400)
RBC: 4.02 MIL/uL — ABNORMAL LOW (ref 4.22–5.81)
RDW: 18.3 % — ABNORMAL HIGH (ref 11.5–15.5)
WBC: 14 10*3/uL — ABNORMAL HIGH (ref 4.0–10.5)
nRBC: 0 % (ref 0.0–0.2)

## 2022-08-01 MED ORDER — AMOXICILLIN 500 MG PO CAPS
500.0000 mg | ORAL_CAPSULE | Freq: Three times a day (TID) | ORAL | 0 refills | Status: DC
  Filled 2022-08-01 (×2): qty 540, 180d supply, fill #0

## 2022-08-01 MED ORDER — AMOXICILLIN-POT CLAVULANATE 875-125 MG PO TABS
1.0000 | ORAL_TABLET | Freq: Two times a day (BID) | ORAL | 0 refills | Status: DC
  Filled 2022-08-01: qty 28, 14d supply, fill #0

## 2022-08-01 NOTE — Progress Notes (Signed)
1930: Did rounds on the patient. He asked for a coke and ice.  2000: Brought the patient coke and ice. He was upset and noticeably crying. He had no further needs.  2030: The patient care tech went into the room to take vital signs and noticed the patient was missing.  2035: Immediately notified security.  2115: Called security to follow up and they said they were unable to find the patient on the hospital property.  2300: Called the house supervisor to notify that the patient was still missing. They said they will start the hospital protocol for the missing patient.
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
## 2022-08-01 NOTE — Progress Notes (Signed)
CSW sent e-mail to Shanda Bumps of financial counseling to request patient be screened for possible Medicaid benefits.  Edwin Dada, MSW, LCSW Transitions of Care  Clinical Social Worker II [phone]

## 2022-08-01 NOTE — Progress Notes (Signed)
Patient ID: James Baker, male   DOB: 1976-08-02, 46 y.o.   MRN: [id number] The patient is a 46 year old gentleman who is seen status post irrigation debridement of an abscess to his right arm.  Postop day 2.  Some concern serosanguineous drainage from his incision over his chest.  Surgical dressing applied to his arm however there is no dressing upon examination over his chest.  Dressings removed.  There is good healing of the incision to his forearm and upper arm sutures are in place there is no gaping no active drainage no erythema no warmth.  Over his chest incision well approximated with sutures there is no active drainage at this time there is some dermatitis and erythema the patient states this is resolving.  Resolving cellulitis seen with dry peeling skin.  No odor no purulence  Dry dressing applied.  Anticipate discharge home on oral antibiotics once cultures have resulted

## 2022-08-02 DIAGNOSIS — L0390 Cellulitis, unspecified: Secondary | ICD-10-CM | POA: Diagnosis not present

## 2022-08-02 LAB — SUSCEPTIBILITY, AER + ANAEROB

## 2022-08-02 LAB — SUSCEPTIBILITY RESULT
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
## 2022-08-04 LAB — AEROBIC/ANAEROBIC CULTURE W GRAM STAIN (SURGICAL/DEEP WOUND)
Culture: NO GROWTH
Gram Stain: NONE SEEN

## 2022-08-05 ENCOUNTER — Telehealth: Payer: Self-pay | Admitting: Orthopedic Surgery

## 2022-08-05 LAB — AEROBIC/ANAEROBIC CULTURE W GRAM STAIN (SURGICAL/DEEP WOUND)

## 2022-08-05 NOTE — Telephone Encounter (Signed)
Patient called needing Rx for an antibiotic. Patient uses [facility] in [place]   the number to contact patient is [phone]

## 2022-08-06 ENCOUNTER — Telehealth: Payer: Self-pay | Admitting: Orthopedic Surgery

## 2022-08-06 NOTE — Telephone Encounter (Signed)
FYI Called patient got recording mailbox is full could not leave message     Left SMS message instead for patient to call back number on phone.       Patient need to come and be seen in the office per [place]

## 2022-08-06 NOTE — Telephone Encounter (Signed)
James Baker, thank you. I see he is now scheduled on 08/08/22!!

## 2022-08-08 ENCOUNTER — Encounter: Payer: Self-pay | Admitting: Family

## 2022-08-18 ENCOUNTER — Other Ambulatory Visit (HOSPITAL_COMMUNITY): Payer: Self-pay

## 2022-08-20 ENCOUNTER — Telehealth: Payer: Self-pay

## 2022-08-20 ENCOUNTER — Telehealth: Payer: Self-pay | Admitting: Orthopedic Surgery

## 2022-08-20 ENCOUNTER — Ambulatory Visit: Payer: Self-pay | Admitting: Infectious Disease

## 2022-08-20 LAB — FUNGAL ORGANISM REFLEX

## 2022-08-20 LAB — FUNGUS CULTURE WITH STAIN

## 2022-08-20 LAB — FUNGUS CULTURE RESULT

## 2022-08-20 NOTE — Telephone Encounter (Signed)
Tried calling pt multiple time and not able to even get it to rind. Will keep trying

## 2022-08-20 NOTE — Telephone Encounter (Signed)
Called patient to reschedule missed appointment. Phone went directly to busy signal and never rang. Unable to leave voicemail.  Binnie Kand, RN

## 2022-08-23 LAB — AEROBIC/ANAEROBIC CULTURE W GRAM STAIN (SURGICAL/DEEP WOUND): Gram Stain: NONE SEEN

## 2022-08-25 LAB — FUNGAL ORGANISM REFLEX

## 2022-08-25 LAB — FUNGUS CULTURE WITH STAIN

## 2022-08-25 LAB — FUNGUS CULTURE RESULT

## 2022-09-03 LAB — ACID FAST CULTURE WITH REFLEXED SENSITIVITIES (MYCOBACTERIA): Acid Fast Culture: NEGATIVE

## 2022-09-06 LAB — ACID FAST CULTURE WITH REFLEXED SENSITIVITIES (MYCOBACTERIA): Acid Fast Culture: NEGATIVE

## 2022-11-07 ENCOUNTER — Other Ambulatory Visit: Payer: Self-pay

## 2023-12-27 ENCOUNTER — Other Ambulatory Visit: Payer: Self-pay

## 2023-12-27 ENCOUNTER — Emergency Department (HOSPITAL_COMMUNITY): Payer: 59

## 2023-12-27 ENCOUNTER — Encounter (HOSPITAL_COMMUNITY): Payer: Self-pay | Admitting: Emergency Medicine

## 2023-12-27 ENCOUNTER — Observation Stay (HOSPITAL_COMMUNITY)
Admission: EM | Admit: 2023-12-27 | Discharge: 2023-12-29 | Disposition: A | Discharge status: Home / Self Care | Payer: 59 | Attending: Internal Medicine | Admitting: Internal Medicine

## 2023-12-27 DIAGNOSIS — E876 Hypokalemia: Secondary | ICD-10-CM | POA: Insufficient documentation

## 2023-12-27 DIAGNOSIS — R062 Wheezing: Secondary | ICD-10-CM | POA: Diagnosis not present

## 2023-12-27 DIAGNOSIS — Z23 Encounter for immunization: Secondary | ICD-10-CM | POA: Insufficient documentation

## 2023-12-27 DIAGNOSIS — S6991XA Unspecified injury of right wrist, hand and finger(s), initial encounter: Secondary | ICD-10-CM | POA: Diagnosis not present

## 2023-12-27 DIAGNOSIS — F17210 Nicotine dependence, cigarettes, uncomplicated: Secondary | ICD-10-CM | POA: Diagnosis not present

## 2023-12-27 DIAGNOSIS — Z79899 Other long term (current) drug therapy: Secondary | ICD-10-CM | POA: Insufficient documentation

## 2023-12-27 DIAGNOSIS — Z720 Tobacco use: Secondary | ICD-10-CM | POA: Diagnosis present

## 2023-12-27 DIAGNOSIS — I10 Essential (primary) hypertension: Secondary | ICD-10-CM | POA: Diagnosis not present

## 2023-12-27 DIAGNOSIS — T23201A Burn of second degree of right hand, unspecified site, initial encounter: Secondary | ICD-10-CM | POA: Diagnosis not present

## 2023-12-27 DIAGNOSIS — Z743 Need for continuous supervision: Secondary | ICD-10-CM | POA: Diagnosis not present

## 2023-12-27 DIAGNOSIS — T23202A Burn of second degree of left hand, unspecified site, initial encounter: Principal | ICD-10-CM | POA: Insufficient documentation

## 2023-12-27 DIAGNOSIS — X000XXA Exposure to flames in uncontrolled fire in building or structure, initial encounter: Secondary | ICD-10-CM | POA: Insufficient documentation

## 2023-12-27 DIAGNOSIS — S2231XA Fracture of one rib, right side, initial encounter for closed fracture: Secondary | ICD-10-CM | POA: Diagnosis not present

## 2023-12-27 DIAGNOSIS — J45901 Unspecified asthma with (acute) exacerbation: Secondary | ICD-10-CM | POA: Diagnosis present

## 2023-12-27 DIAGNOSIS — T310 Burns involving less than 10% of body surface: Secondary | ICD-10-CM | POA: Diagnosis not present

## 2023-12-27 DIAGNOSIS — R9431 Abnormal electrocardiogram [ECG] [EKG]: Secondary | ICD-10-CM | POA: Diagnosis not present

## 2023-12-27 DIAGNOSIS — T23209A Burn of second degree of unspecified hand, unspecified site, initial encounter: Secondary | ICD-10-CM | POA: Diagnosis not present

## 2023-12-27 DIAGNOSIS — J441 Chronic obstructive pulmonary disease with (acute) exacerbation: Secondary | ICD-10-CM | POA: Insufficient documentation

## 2023-12-27 DIAGNOSIS — T59811A Toxic effect of smoke, accidental (unintentional), initial encounter: Secondary | ICD-10-CM | POA: Diagnosis not present

## 2023-12-27 DIAGNOSIS — R000 Tachycardia, unspecified: Secondary | ICD-10-CM | POA: Diagnosis not present

## 2023-12-27 LAB — BLOOD GAS, VENOUS
Acid-Base Excess: 3 mmol/L — ABNORMAL HIGH (ref 0.0–2.0)
Bicarbonate: 29 mmol/L — ABNORMAL HIGH (ref 20.0–28.0)
Drawn by: 4410
O2 Saturation: 85.3 %
Patient temperature: 36.6
pCO2, Ven: 48 mm[Hg] (ref 44–60)
pH, Ven: 7.39 (ref 7.25–7.43)
pO2, Ven: 49 mm[Hg] — ABNORMAL HIGH (ref 32–45)

## 2023-12-27 LAB — COOXEMETRY PANEL
Carboxyhemoglobin: 3 % — ABNORMAL HIGH (ref 0.5–1.5)
Methemoglobin: <0.7 % (ref 0.0–1.5)
O2 Saturation: 99 %
Total hemoglobin: 13.1 g/dL (ref 12.0–16.0)

## 2023-12-27 LAB — COMPREHENSIVE METABOLIC PANEL
ALT: 13 U/L (ref 0–44)
AST: 21 U/L (ref 15–41)
Albumin: 3.8 g/dL (ref 3.5–5.0)
Alkaline Phosphatase: 49 U/L (ref 38–126)
Anion gap: 10 (ref 5–15)
BUN: 19 mg/dL (ref 6–20)
CO2: 25 mmol/L (ref 22–32)
Calcium: 9 mg/dL (ref 8.9–10.3)
Chloride: 103 mmol/L (ref 98–111)
Creatinine, Ser: 0.84 mg/dL (ref 0.61–1.24)
GFR, Estimated: >60 mL/min (ref >60)
Glucose, Bld: 145 mg/dL — ABNORMAL HIGH (ref 70–99)
Potassium: 3.2 mmol/L — ABNORMAL LOW (ref 3.5–5.1)
Sodium: 138 mmol/L (ref 135–145)
Total Bilirubin: 0.5 mg/dL (ref 0.0–1.2)
Total Protein: 7.1 g/dL (ref 6.5–8.1)

## 2023-12-27 LAB — CBC WITH DIFFERENTIAL/PLATELET
Abs Immature Granulocytes: 0 10*3/uL (ref 0.00–0.07)
Basophils Absolute: 0 10*3/uL (ref 0.0–0.1)
Basophils Relative: 0 %
Eosinophils Absolute: 0.5 10*3/uL (ref 0.0–0.5)
Eosinophils Relative: 5 %
HCT: 44.4 % (ref 39.0–52.0)
Hemoglobin: 14 g/dL (ref 13.0–17.0)
Lymphocytes Relative: 48 %
Lymphs Abs: 4.8 10*3/uL — ABNORMAL HIGH (ref 0.7–4.0)
MCH: 25.4 pg — ABNORMAL LOW (ref 26.0–34.0)
MCHC: 31.5 g/dL (ref 30.0–36.0)
MCV: 80.4 fL (ref 80.0–100.0)
Monocytes Absolute: 0.8 10*3/uL (ref 0.1–1.0)
Monocytes Relative: 8 %
Neutro Abs: 3.9 10*3/uL (ref 1.7–7.7)
Neutrophils Relative %: 39 %
Platelets: 328 10*3/uL (ref 150–400)
RBC: 5.52 MIL/uL (ref 4.22–5.81)
RDW: 16.5 % — ABNORMAL HIGH (ref 11.5–15.5)
WBC: 10 10*3/uL (ref 4.0–10.5)
nRBC: 0 % (ref 0.0–0.2)

## 2023-12-27 LAB — RAPID URINE DRUG SCREEN, HOSP PERFORMED
Amphetamines: POSITIVE — AB
Barbiturates: NOT DETECTED
Benzodiazepines: NOT DETECTED
Cocaine: POSITIVE — AB
Opiates: POSITIVE — AB
Tetrahydrocannabinol: NOT DETECTED

## 2023-12-27 MED ORDER — POTASSIUM CHLORIDE CRYS ER 20 MEQ PO TBCR
40.0000 meq | EXTENDED_RELEASE_TABLET | Freq: Once | ORAL | Status: AC
  Administered 2023-12-27: 40 meq via ORAL
  Filled 2023-12-27: qty 2

## 2023-12-27 MED ORDER — ONDANSETRON HCL 4 MG/2ML IJ SOLN
4.0000 mg | Freq: Once | INTRAMUSCULAR | Status: AC
Start: 2023-12-27 — End: 2023-12-27
  Administered 2023-12-27: 4 mg via INTRAVENOUS
  Filled 2023-12-27: qty 2

## 2023-12-27 MED ORDER — METHYLPREDNISOLONE SODIUM SUCC 40 MG IJ SOLR
40.0000 mg | Freq: Two times a day (BID) | INTRAMUSCULAR | Status: DC
  Administered 2023-12-27 – 2023-12-29 (×4): 40 mg via INTRAVENOUS
  Filled 2023-12-27 (×4): qty 1

## 2023-12-27 MED ORDER — MORPHINE SULFATE (PF) 4 MG/ML IV SOLN
4.0000 mg | Freq: Once | INTRAVENOUS | Status: AC
  Administered 2023-12-27: 4 mg via INTRAVENOUS
  Filled 2023-12-27: qty 1

## 2023-12-27 MED ORDER — METHYLPREDNISOLONE SODIUM SUCC 125 MG IJ SOLR
125.0000 mg | Freq: Once | INTRAMUSCULAR | Status: AC
  Administered 2023-12-27: 125 mg via INTRAVENOUS
  Filled 2023-12-27: qty 2

## 2023-12-27 MED ORDER — TETANUS-DIPHTH-ACELL PERTUSSIS 5-2.5-18.5 LF-MCG/0.5 IM SUSY
0.5000 mL | PREFILLED_SYRINGE | Freq: Once | INTRAMUSCULAR | Status: AC
  Administered 2023-12-27: 0.5 mL via INTRAMUSCULAR
  Filled 2023-12-27: qty 0.5

## 2023-12-27 MED ORDER — IPRATROPIUM-ALBUTEROL 0.5-2.5 (3) MG/3ML IN SOLN
3.0000 mL | Freq: Two times a day (BID) | RESPIRATORY_TRACT | Status: DC
  Administered 2023-12-28 – 2023-12-29 (×3): 3 mL via RESPIRATORY_TRACT
  Filled 2023-12-27 (×3): qty 3

## 2023-12-27 MED ORDER — ONDANSETRON HCL 4 MG/2ML IJ SOLN: 4.0000 mg | Freq: Four times a day (QID) | INTRAMUSCULAR | Status: DC | PRN

## 2023-12-27 MED ORDER — ONDANSETRON HCL 4 MG PO TABS: 4.0000 mg | ORAL_TABLET | Freq: Four times a day (QID) | ORAL | Status: DC | PRN

## 2023-12-27 MED ORDER — ACETAMINOPHEN 325 MG PO TABS
650.0000 mg | ORAL_TABLET | Freq: Four times a day (QID) | ORAL | Status: DC | PRN
  Administered 2023-12-27 – 2023-12-29 (×2): 650 mg via ORAL
  Filled 2023-12-27 (×2): qty 2

## 2023-12-27 MED ORDER — ARFORMOTEROL TARTRATE 15 MCG/2ML IN NEBU
15.0000 ug | INHALATION_SOLUTION | Freq: Two times a day (BID) | RESPIRATORY_TRACT | Status: DC
  Administered 2023-12-27 – 2023-12-29 (×4): 15 ug via RESPIRATORY_TRACT
  Filled 2023-12-27 (×4): qty 2

## 2023-12-27 MED ORDER — IPRATROPIUM-ALBUTEROL 0.5-2.5 (3) MG/3ML IN SOLN
3.0000 mL | Freq: Once | RESPIRATORY_TRACT | Status: AC
  Administered 2023-12-27: 3 mL via RESPIRATORY_TRACT
  Filled 2023-12-27: qty 3

## 2023-12-27 MED ORDER — BUDESONIDE 0.5 MG/2ML IN SUSP
0.5000 mg | Freq: Two times a day (BID) | RESPIRATORY_TRACT | Status: DC
  Administered 2023-12-27 – 2023-12-29 (×4): 0.5 mg via RESPIRATORY_TRACT
  Filled 2023-12-27 (×4): qty 2

## 2023-12-27 MED ORDER — SODIUM CHLORIDE 0.9 % IV BOLUS
1000.0000 mL | Freq: Once | INTRAVENOUS | Status: AC
  Administered 2023-12-27: 1000 mL via INTRAVENOUS

## 2023-12-27 MED ORDER — IPRATROPIUM-ALBUTEROL 0.5-2.5 (3) MG/3ML IN SOLN
3.0000 mL | Freq: Four times a day (QID) | RESPIRATORY_TRACT | Status: DC
  Administered 2023-12-27 (×2): 3 mL via RESPIRATORY_TRACT
  Filled 2023-12-27 (×2): qty 3

## 2023-12-27 MED ORDER — ACETAMINOPHEN 650 MG RE SUPP: 650.0000 mg | Freq: Four times a day (QID) | RECTAL | Status: DC | PRN

## 2023-12-27 MED ORDER — ENOXAPARIN SODIUM 40 MG/0.4ML IJ SOSY
40.0000 mg | PREFILLED_SYRINGE | INTRAMUSCULAR | Status: DC
  Administered 2023-12-27 – 2023-12-28 (×2): 40 mg via SUBCUTANEOUS
  Filled 2023-12-27 (×2): qty 0.4

## 2023-12-27 NOTE — Plan of Care (Signed)
Problem: Education: Goal: Knowledge of General Education information will improve Description Including pain rating scale, medication(s)/side effects and non-pharmacologic comfort measures Outcome: Progressing

## 2023-12-27 NOTE — Progress Notes (Signed)
Pt arrived to room 340 via WC from ED. Pt ambulatory from chair to bed without assistance. Pt oriented to room and safety procedures, bed in low position and call bell within reach.

## 2023-12-27 NOTE — ED Notes (Signed)
Noelle Penner from [employer] called and said when pt gets close to discharge to call [employer] and they will come up here and get patient to help him with home situations.    Call [facility] at [phone] this is the on call duty officer
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
## 2023-12-27 NOTE — ED Notes (Signed)
Pt has requested for nurse not to do wound care at this time. Pt states he is hurting and tired, and just wants to rest some. Pain medication given at this time.
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
## 2023-12-28 ENCOUNTER — Telehealth: Payer: Self-pay | Admitting: Plastic Surgery

## 2023-12-28 DIAGNOSIS — T59811A Toxic effect of smoke, accidental (unintentional), initial encounter: Secondary | ICD-10-CM

## 2023-12-28 DIAGNOSIS — T23209A Burn of second degree of unspecified hand, unspecified site, initial encounter: Secondary | ICD-10-CM

## 2023-12-28 DIAGNOSIS — J45901 Unspecified asthma with (acute) exacerbation: Secondary | ICD-10-CM | POA: Diagnosis not present

## 2023-12-28 LAB — CBC
HCT: 46 % (ref 39.0–52.0)
Hemoglobin: 14.5 g/dL (ref 13.0–17.0)
MCH: 25.7 pg — ABNORMAL LOW (ref 26.0–34.0)
MCHC: 31.5 g/dL (ref 30.0–36.0)
MCV: 81.4 fL (ref 80.0–100.0)
Platelets: 171 10*3/uL (ref 150–400)
RBC: 5.65 MIL/uL (ref 4.22–5.81)
RDW: 17.4 % — ABNORMAL HIGH (ref 11.5–15.5)
WBC: 16.2 10*3/uL — ABNORMAL HIGH (ref 4.0–10.5)
nRBC: 0 % (ref 0.0–0.2)

## 2023-12-28 LAB — BASIC METABOLIC PANEL
Anion gap: 13 (ref 5–15)
BUN: 19 mg/dL (ref 6–20)
CO2: 20 mmol/L — ABNORMAL LOW (ref 22–32)
Calcium: 9.6 mg/dL (ref 8.9–10.3)
Chloride: 104 mmol/L (ref 98–111)
Creatinine, Ser: 0.82 mg/dL (ref 0.61–1.24)
GFR, Estimated: >60 mL/min (ref >60)
Glucose, Bld: 131 mg/dL — ABNORMAL HIGH (ref 70–99)
Potassium: 4.4 mmol/L (ref 3.5–5.1)
Sodium: 137 mmol/L (ref 135–145)

## 2023-12-28 LAB — HIV ANTIBODY (ROUTINE TESTING W REFLEX): HIV Screen 4th Generation wRfx: NONREACTIVE
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
## 2023-12-28 NOTE — Plan of Care (Signed)
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
## 2023-12-29 DIAGNOSIS — J45901 Unspecified asthma with (acute) exacerbation: Secondary | ICD-10-CM | POA: Diagnosis not present

## 2023-12-29 DIAGNOSIS — T23209A Burn of second degree of unspecified hand, unspecified site, initial encounter: Secondary | ICD-10-CM | POA: Diagnosis not present

## 2023-12-29 MED ORDER — SILVER SULFADIAZINE 1 % EX CREA
TOPICAL_CREAM | Freq: Two times a day (BID) | CUTANEOUS | Status: DC
Start: 2023-12-29 — End: 2023-12-29
  Filled 2023-12-29: qty 85

## 2023-12-29 MED ORDER — ALBUTEROL SULFATE HFA 108 (90 BASE) MCG/ACT IN AERS: 2.0000 | INHALATION_SPRAY | Freq: Four times a day (QID) | RESPIRATORY_TRACT | 1 refills | Status: AC | PRN

## 2023-12-29 NOTE — TOC Transition Note (Signed)
Transition of Care [facility]) - Discharge Note   Patient Details  Name: James Baker MRN: [id number] Date of Birth: 06/23/1976  Transition of Care [facility]) CM/SW Contact:  Villa Herb, LCSWA Phone Number: 12/29/2023, 11:42 AM   Clinical Narrative:    CSW set up follow up plastic surgery appointment for pt. Appointment made for Thursday 1/30 at 1:30pm, CSW to add to AVS for pt.    CSW also updated by [employer] that pt provided them with his sister Crystal's phone number so she can be the point of contact. Once pt is discharged they will follow up and get him established with a [employer] case manager that will assist pt in the community with resources. TOC signing off.   Final next level of care: Home/Self Care Barriers to Discharge: No Barriers Identified   Patient Goals and CMS Choice Patient states their goals for this hospitalization and ongoing recovery are:: go to a hotel CMS Medicare.gov Compare Post Acute Care list provided to:: Patient Choice offered to / list presented to : Patient      Discharge Placement                       Discharge Plan and Services Additional resources added to the After Visit Summary for   In-house Referral: Clinical Social Work Discharge Planning Services: CM Consult                                 Social Drivers of Health (SDOH) Interventions SDOH Screenings   Food Insecurity: Food Insecurity Present (12/27/2023)  Housing: High Risk (12/27/2023)  Transportation Needs: Unmet Transportation Needs (12/27/2023)  Utilities: At Risk (12/27/2023)  Tobacco Use: High Risk (12/27/2023)     Readmission Risk Interventions    07/22/2022   10:02 AM  Readmission Risk Prevention Plan  Post Dischage Appt Complete  Medication Screening Complete  Transportation Screening Complete
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
## 2023-12-29 NOTE — Plan of Care (Signed)
Problem: Activity: Goal: Risk for activity intolerance will decrease Outcome: Progressing   Problem: Coping: Goal: Level of anxiety will decrease Outcome: Progressing   Problem: Pain Managment: Goal: General experience of comfort will improve and/or be controlled Outcome: Progressing

## 2023-12-31 ENCOUNTER — Ambulatory Visit: Payer: 59 | Admitting: Surgical
